# Patient Record
Sex: Female | Born: 1955 | Race: White | Hispanic: No | Marital: Married | Smoking: Never smoker
Health system: Southern US, Community
[De-identification: ages and names within clinical notes are randomized; demographics above are authoritative.]

## PROBLEM LIST (undated history)

## (undated) DIAGNOSIS — D696 Thrombocytopenia, unspecified: Secondary | ICD-10-CM

## (undated) DIAGNOSIS — F458 Other somatoform disorders: Secondary | ICD-10-CM

## (undated) DIAGNOSIS — Z862 Personal history of diseases of the blood and blood-forming organs and certain disorders involving the immune mechanism: Secondary | ICD-10-CM

## (undated) DIAGNOSIS — Z87898 Personal history of other specified conditions: Secondary | ICD-10-CM

## (undated) DIAGNOSIS — E785 Hyperlipidemia, unspecified: Secondary | ICD-10-CM

## (undated) DIAGNOSIS — F419 Anxiety disorder, unspecified: Secondary | ICD-10-CM

## (undated) DIAGNOSIS — M1990 Unspecified osteoarthritis, unspecified site: Secondary | ICD-10-CM

## (undated) DIAGNOSIS — E039 Hypothyroidism, unspecified: Secondary | ICD-10-CM

## (undated) DIAGNOSIS — Z9289 Personal history of other medical treatment: Secondary | ICD-10-CM

## (undated) DIAGNOSIS — J45909 Unspecified asthma, uncomplicated: Secondary | ICD-10-CM

## (undated) HISTORY — PX: OTHER SURGICAL HISTORY: SHX169

## (undated) HISTORY — PX: ROTATOR CUFF REPAIR: SHX139

## (undated) HISTORY — PX: ABDOMINAL HYSTERECTOMY: SHX81

## (undated) HISTORY — PX: COLONOSCOPY: SHX174

---

## 2009-07-15 HISTORY — PX: TOTAL KNEE ARTHROPLASTY: SHX125

## 2016-05-30 ENCOUNTER — Other Ambulatory Visit: Payer: Self-pay | Admitting: Internal Medicine

## 2016-05-30 DIAGNOSIS — E039 Hypothyroidism, unspecified: Secondary | ICD-10-CM

## 2016-06-13 ENCOUNTER — Other Ambulatory Visit: Payer: Self-pay

## 2016-07-17 ENCOUNTER — Ambulatory Visit
Admission: RE | Admit: 2016-07-17 | Discharge: 2016-07-17 | Disposition: A | Discharge status: Home / Self Care | Payer: No Typology Code available for payment source | Source: Ambulatory Visit | Attending: Internal Medicine | Admitting: Internal Medicine

## 2016-07-17 DIAGNOSIS — E039 Hypothyroidism, unspecified: Secondary | ICD-10-CM

## 2016-10-18 ENCOUNTER — Other Ambulatory Visit: Payer: Self-pay | Admitting: Internal Medicine

## 2016-10-18 DIAGNOSIS — Z1231 Encounter for screening mammogram for malignant neoplasm of breast: Secondary | ICD-10-CM

## 2016-11-14 ENCOUNTER — Ambulatory Visit
Admission: RE | Admit: 2016-11-14 | Discharge: 2016-11-14 | Disposition: A | Discharge status: Home / Self Care | Payer: No Typology Code available for payment source | Source: Ambulatory Visit | Attending: Internal Medicine | Admitting: Internal Medicine

## 2016-11-14 DIAGNOSIS — Z1231 Encounter for screening mammogram for malignant neoplasm of breast: Secondary | ICD-10-CM

## 2017-04-14 ENCOUNTER — Ambulatory Visit (HOSPITAL_BASED_OUTPATIENT_CLINIC_OR_DEPARTMENT_OTHER)
Admission: RE | Admit: 2017-04-14 | Discharge: 2017-04-14 | Disposition: A | Discharge status: Home / Self Care | Payer: No Typology Code available for payment source | Source: Ambulatory Visit | Attending: Internal Medicine | Admitting: Internal Medicine

## 2017-04-14 ENCOUNTER — Other Ambulatory Visit: Payer: Self-pay | Admitting: Internal Medicine

## 2017-04-14 ENCOUNTER — Other Ambulatory Visit (HOSPITAL_BASED_OUTPATIENT_CLINIC_OR_DEPARTMENT_OTHER): Payer: Self-pay | Admitting: Internal Medicine

## 2017-04-14 DIAGNOSIS — N281 Cyst of kidney, acquired: Secondary | ICD-10-CM | POA: Diagnosis not present

## 2017-04-14 DIAGNOSIS — K760 Fatty (change of) liver, not elsewhere classified: Secondary | ICD-10-CM | POA: Insufficient documentation

## 2017-04-14 DIAGNOSIS — R109 Unspecified abdominal pain: Secondary | ICD-10-CM

## 2017-04-15 ENCOUNTER — Other Ambulatory Visit: Payer: No Typology Code available for payment source

## 2017-07-18 ENCOUNTER — Other Ambulatory Visit: Payer: Self-pay | Admitting: Internal Medicine

## 2017-07-18 ENCOUNTER — Ambulatory Visit
Admission: RE | Admit: 2017-07-18 | Discharge: 2017-07-18 | Disposition: A | Discharge status: Home / Self Care | Payer: No Typology Code available for payment source | Source: Ambulatory Visit | Attending: Internal Medicine | Admitting: Internal Medicine

## 2017-07-18 DIAGNOSIS — R05 Cough: Secondary | ICD-10-CM

## 2017-07-18 DIAGNOSIS — R059 Cough, unspecified: Secondary | ICD-10-CM

## 2017-07-18 DIAGNOSIS — J9801 Acute bronchospasm: Secondary | ICD-10-CM

## 2017-12-19 ENCOUNTER — Other Ambulatory Visit: Payer: Self-pay | Admitting: Internal Medicine

## 2017-12-19 DIAGNOSIS — Z1231 Encounter for screening mammogram for malignant neoplasm of breast: Secondary | ICD-10-CM

## 2018-01-13 ENCOUNTER — Ambulatory Visit
Admission: RE | Admit: 2018-01-13 | Discharge: 2018-01-13 | Disposition: A | Discharge status: Home / Self Care | Payer: PRIVATE HEALTH INSURANCE | Source: Ambulatory Visit | Attending: Internal Medicine | Admitting: Internal Medicine

## 2018-01-13 DIAGNOSIS — Z1231 Encounter for screening mammogram for malignant neoplasm of breast: Secondary | ICD-10-CM

## 2019-04-12 ENCOUNTER — Other Ambulatory Visit: Payer: Self-pay | Admitting: Physician Assistant

## 2019-04-12 DIAGNOSIS — Z1231 Encounter for screening mammogram for malignant neoplasm of breast: Secondary | ICD-10-CM

## 2019-04-29 DIAGNOSIS — U071 COVID-19: Secondary | ICD-10-CM

## 2019-04-29 HISTORY — DX: COVID-19: U07.1

## 2019-05-28 ENCOUNTER — Ambulatory Visit
Admission: RE | Admit: 2019-05-28 | Discharge: 2019-05-28 | Disposition: A | Discharge status: Home / Self Care | Payer: PRIVATE HEALTH INSURANCE | Source: Ambulatory Visit | Attending: Physician Assistant | Admitting: Physician Assistant

## 2019-05-28 ENCOUNTER — Other Ambulatory Visit: Payer: Self-pay

## 2019-05-28 DIAGNOSIS — Z1231 Encounter for screening mammogram for malignant neoplasm of breast: Secondary | ICD-10-CM

## 2019-06-18 ENCOUNTER — Ambulatory Visit: Payer: Self-pay | Admitting: Orthopedic Surgery

## 2019-06-18 DIAGNOSIS — M1611 Unilateral primary osteoarthritis, right hip: Secondary | ICD-10-CM

## 2019-06-18 NOTE — H&P (View-Only) (Signed)
TOTAL HIP ADMISSION H&P  Patient is admitted for right total hip arthroplasty.  Subjective:  Chief Complaint: right hip pain  HPI: Janice Fernandez, 63 y.o. female, has a history of pain and functional disability in the right hip(s) due to arthritis and patient has failed non-surgical conservative treatments for greater than 12 weeks to include NSAID's and/or analgesics, flexibility and strengthening excercises, weight reduction as appropriate and activity modification.  Onset of symptoms was gradual starting 5 years ago with gradually worsening course since that time.The patient noted no past surgery on the right hip(s).  Patient currently rates pain in the right hip at 10 out of 10 with activity. Patient has night pain, worsening of pain with activity and weight bearing, trendelenberg gait, pain that interfers with activities of daily living and pain with passive range of motion. Patient has evidence of subchondral cysts, subchondral sclerosis, periarticular osteophytes and joint space narrowing by imaging studies. This condition presents safety issues increasing the risk of falls.  There is no current active infection.  There are no problems to display for this patient.  Past Medical History:  Diagnosis Date  . Anxiety   . Asthma   . COVID-19 04/29/2019  . History of anemia   . History of blood transfusion   . History of blood transfusion reaction    Developed fever transfusion stopped, next day transfusion done without any reaction  . Hyperlipidemia   . Hypothyroidism   . OA (osteoarthritis)   . Teeth grinding   . Thrombocytopenia ([facility])     Past Surgical History:  Procedure Laterality Date  . ABDOMINAL HYSTERECTOMY    . COLONOSCOPY    . ROTATOR CUFF REPAIR Right   . Thumb Replacement  Left   . TOTAL KNEE ARTHROPLASTY Right 2011    No current facility-administered medications for this visit.   No current outpatient medications on file.   Facility-Administered Medications  Ordered in Other Visits  Medication Dose Route Frequency Provider Last Rate Last Admin  . 0.9 %  sodium chloride infusion   Intravenous Continuous Rod Can, MD 75 mL/hr at 06/24/19 0559 New Bag at 06/24/19 0559  . acetaminophen (OFIRMEV) IV 1,000 mg  1,000 mg Intravenous To OR Dawna Jakes, Aaron Edelman, MD      . ceFAZolin (ANCEF) IVPB 2g/100 mL premix  2 g Intravenous On Call to [place], Aaron Edelman, MD      . chlorhexidine (HIBICLENS) 4 % liquid 4 application  60 mL Topical Once Edrik Rundle, Aaron Edelman, MD      . chlorhexidine (HIBICLENS) 4 % liquid 4 application  60 mL Topical Once Rod Can, MD      . lactated ringers infusion   Intravenous Continuous PRN Lavina Hamman, CRNA   New Bag at 06/24/19 [phone]  . povidone-iodine 10 % swab 2 application  2 application Topical Once Jasper Ruminski, Aaron Edelman, MD      . tranexamic acid (CYKLOKAPRON) IVPB 1,000 mg  1,000 mg Intravenous To OR Rod Can, MD       Allergies  Allergen Reactions  . Dilaudid [Hydromorphone Hcl] Nausea And Vomiting    Skin turned red  . Sulfa Antibiotics Nausea Only    Social History   Tobacco Use  . Smoking status: Never Smoker  . Smokeless tobacco: Never Used  Substance Use Topics  . Alcohol use: Yes    Comment: occ    Family History  Problem Relation Age of Onset  . Breast cancer Mother      Review of Systems  Constitutional: Negative.  HENT: Negative.   Eyes: Negative.   Respiratory: Negative.   Cardiovascular: Negative.   Gastrointestinal: Negative.   Genitourinary: Negative.   Musculoskeletal: Positive for joint pain.  Skin: Negative.   Neurological: Negative.   Endo/Heme/Allergies: Negative.   Psychiatric/Behavioral: The patient is nervous/anxious.     Objective:  Physical Exam  Vitals reviewed. Constitutional: She is oriented to person, place, and time. She appears well-developed and well-nourished.  HENT:  Head: Normocephalic and atraumatic.  Eyes: Pupils are equal, round, and reactive to light.  Conjunctivae and EOM are normal.  Neck: Normal range of motion. Neck supple.  Cardiovascular: Normal rate, regular rhythm and intact distal pulses.  Respiratory: Effort normal. No respiratory distress.  GI: Soft. She exhibits no distension.  Genitourinary:    Genitourinary Comments: deferred   Musculoskeletal:     Right hip: She exhibits decreased range of motion and bony tenderness.  Neurological: She is alert and oriented to person, place, and time. She has normal reflexes.  Skin: Skin is warm and dry.  Psychiatric: She has a normal mood and affect. Her behavior is normal. Judgment and thought content normal.    Vital signs in last 24 hours: @VSRANGES @  Labs:   Estimated body mass index is 32.14 kg/m as calculated from the following:   Height as of 06/22/19: 5' 4.5" (1.638 m).   Weight as of 06/22/19: 86.3 kg.   Imaging Review Plain radiographs demonstrate severe degenerative joint disease of the right hip(s). The bone quality appears to be adequate for age and reported activity level.      Assessment/Plan:  End stage arthritis, right hip(s)  The patient history, physical examination, clinical judgement of the provider and imaging studies are consistent with end stage degenerative joint disease of the right hip(s) and total hip arthroplasty is deemed medically necessary. The treatment options including medical management, injection therapy, arthroscopy and arthroplasty were discussed at length. The risks and benefits of total hip arthroplasty were presented and reviewed. The risks due to aseptic loosening, infection, stiffness, dislocation/subluxation,  thromboembolic complications and other imponderables were discussed.  The patient acknowledged the explanation, agreed to proceed with the plan and consent was signed. Patient is being admitted for inpatient treatment for surgery, pain control, PT, OT, prophylactic antibiotics, VTE prophylaxis, progressive ambulation and ADL's and  discharge planning.The patient is planning to be discharged home with HEP    Patient's anticipated LOS is less than 2 midnights, meeting these requirements: - Younger than 43 - Lives within 1 hour of care - Has a competent adult at home to recover with post-op recover - NO history of  - Chronic pain requiring opiods  - Diabetes  - Coronary Artery Disease  - Heart failure  - Heart attack  - Stroke  - DVT/VTE  - Cardiac arrhythmia  - Respiratory Failure/COPD  - Renal failure  - Anemia  - Advanced Liver disease

## 2019-06-18 NOTE — H&P (Signed)
TOTAL HIP ADMISSION H&P  Patient is admitted for right total hip arthroplasty.  Subjective:  Chief Complaint: right hip pain  HPI: Janice Fernandez, 63 y.o. female, has a history of pain and functional disability in the right hip(s) due to arthritis and patient has failed non-surgical conservative treatments for greater than 12 weeks to include NSAID's and/or analgesics, flexibility and strengthening excercises, weight reduction as appropriate and activity modification.  Onset of symptoms was gradual starting 5 years ago with gradually worsening course since that time.The patient noted no past surgery on the right hip(s).  Patient currently rates pain in the right hip at 10 out of 10 with activity. Patient has night pain, worsening of pain with activity and weight bearing, trendelenberg gait, pain that interfers with activities of daily living and pain with passive range of motion. Patient has evidence of subchondral cysts, subchondral sclerosis, periarticular osteophytes and joint space narrowing by imaging studies. This condition presents safety issues increasing the risk of falls.  There is no current active infection.  There are no problems to display for this patient.  Past Medical History:  Diagnosis Date  . Anxiety   . Asthma   . COVID-19 04/29/2019  . History of anemia   . History of blood transfusion   . History of blood transfusion reaction    Developed fever transfusion stopped, next day transfusion done without any reaction  . Hyperlipidemia   . Hypothyroidism   . OA (osteoarthritis)   . Teeth grinding   . Thrombocytopenia ([facility])     Past Surgical History:  Procedure Laterality Date  . ABDOMINAL HYSTERECTOMY    . COLONOSCOPY    . ROTATOR CUFF REPAIR Right   . Thumb Replacement  Left   . TOTAL KNEE ARTHROPLASTY Right 2011    No current facility-administered medications for this visit.   No current outpatient medications on file.   Facility-Administered Medications  Ordered in Other Visits  Medication Dose Route Frequency Provider Last Rate Last Admin  . 0.9 %  sodium chloride infusion   Intravenous Continuous Rod Can, MD 75 mL/hr at 06/24/19 0559 New Bag at 06/24/19 0559  . acetaminophen (OFIRMEV) IV 1,000 mg  1,000 mg Intravenous To OR Nadav Swindell, Aaron Edelman, MD      . ceFAZolin (ANCEF) IVPB 2g/100 mL premix  2 g Intravenous On Call to [place], Aaron Edelman, MD      . chlorhexidine (HIBICLENS) 4 % liquid 4 application  60 mL Topical Once Mckenzie Bove, Aaron Edelman, MD      . chlorhexidine (HIBICLENS) 4 % liquid 4 application  60 mL Topical Once Rod Can, MD      . lactated ringers infusion   Intravenous Continuous PRN Lavina Hamman, CRNA   New Bag at 06/24/19 [phone]  . povidone-iodine 10 % swab 2 application  2 application Topical Once Andrick Rust, Aaron Edelman, MD      . tranexamic acid (CYKLOKAPRON) IVPB 1,000 mg  1,000 mg Intravenous To OR Rod Can, MD       Allergies  Allergen Reactions  . Dilaudid [Hydromorphone Hcl] Nausea And Vomiting    Skin turned red  . Sulfa Antibiotics Nausea Only    Social History   Tobacco Use  . Smoking status: Never Smoker  . Smokeless tobacco: Never Used  Substance Use Topics  . Alcohol use: Yes    Comment: occ    Family History  Problem Relation Age of Onset  . Breast cancer Mother      Review of Systems  Constitutional: Negative.  HENT: Negative.   Eyes: Negative.   Respiratory: Negative.   Cardiovascular: Negative.   Gastrointestinal: Negative.   Genitourinary: Negative.   Musculoskeletal: Positive for joint pain.  Skin: Negative.   Neurological: Negative.   Endo/Heme/Allergies: Negative.   Psychiatric/Behavioral: The patient is nervous/anxious.     Objective:  Physical Exam  Vitals reviewed. Constitutional: She is oriented to person, place, and time. She appears well-developed and well-nourished.  HENT:  Head: Normocephalic and atraumatic.  Eyes: Pupils are equal, round, and reactive to light.  Conjunctivae and EOM are normal.  Neck: Normal range of motion. Neck supple.  Cardiovascular: Normal rate, regular rhythm and intact distal pulses.  Respiratory: Effort normal. No respiratory distress.  GI: Soft. She exhibits no distension.  Genitourinary:    Genitourinary Comments: deferred   Musculoskeletal:     Right hip: She exhibits decreased range of motion and bony tenderness.  Neurological: She is alert and oriented to person, place, and time. She has normal reflexes.  Skin: Skin is warm and dry.  Psychiatric: She has a normal mood and affect. Her behavior is normal. Judgment and thought content normal.    Vital signs in last 24 hours: @VSRANGES @  Labs:   Estimated body mass index is 32.14 kg/m as calculated from the following:   Height as of 06/22/19: 5' 4.5" (1.638 m).   Weight as of 06/22/19: 86.3 kg.   Imaging Review Plain radiographs demonstrate severe degenerative joint disease of the right hip(s). The bone quality appears to be adequate for age and reported activity level.      Assessment/Plan:  End stage arthritis, right hip(s)  The patient history, physical examination, clinical judgement of the provider and imaging studies are consistent with end stage degenerative joint disease of the right hip(s) and total hip arthroplasty is deemed medically necessary. The treatment options including medical management, injection therapy, arthroscopy and arthroplasty were discussed at length. The risks and benefits of total hip arthroplasty were presented and reviewed. The risks due to aseptic loosening, infection, stiffness, dislocation/subluxation,  thromboembolic complications and other imponderables were discussed.  The patient acknowledged the explanation, agreed to proceed with the plan and consent was signed. Patient is being admitted for inpatient treatment for surgery, pain control, PT, OT, prophylactic antibiotics, VTE prophylaxis, progressive ambulation and ADL's and  discharge planning.The patient is planning to be discharged home with HEP    Patient's anticipated LOS is less than 2 midnights, meeting these requirements: - Younger than 43 - Lives within 1 hour of care - Has a competent adult at home to recover with post-op recover - NO history of  - Chronic pain requiring opiods  - Diabetes  - Coronary Artery Disease  - Heart failure  - Heart attack  - Stroke  - DVT/VTE  - Cardiac arrhythmia  - Respiratory Failure/COPD  - Renal failure  - Anemia  - Advanced Liver disease

## 2019-06-21 ENCOUNTER — Encounter (HOSPITAL_COMMUNITY): Payer: Self-pay

## 2019-06-21 ENCOUNTER — Other Ambulatory Visit (HOSPITAL_COMMUNITY)
Admission: RE | Admit: 2019-06-21 | Discharge: 2019-06-21 | Disposition: A | Discharge status: Home / Self Care | Payer: PRIVATE HEALTH INSURANCE | Source: Ambulatory Visit | Attending: Orthopedic Surgery | Admitting: Orthopedic Surgery

## 2019-06-21 DIAGNOSIS — Z20828 Contact with and (suspected) exposure to other viral communicable diseases: Secondary | ICD-10-CM | POA: Diagnosis not present

## 2019-06-21 DIAGNOSIS — Z01812 Encounter for preprocedural laboratory examination: Secondary | ICD-10-CM | POA: Diagnosis not present

## 2019-06-21 NOTE — Patient Instructions (Signed)
DUE TO COVID-19 ONLY ONE VISITOR IS ALLOWED TO COME WITH YOU AND STAY IN THE WAITING ROOM ONLY DURING PRE OP AND PROCEDURE. THE ONE VISITOR MAY VISIT WITH YOU IN YOUR PRIVATE ROOM DURING VISITING HOURS ONLY!!   COVID SWAB TESTING COMPLETED ON:  Monday, Dec. 7, 2020   (Must self quarantine after testing. Follow instructions on handout.)             Your procedure is scheduled on: Thursday, Dec. 10, 2020   [facility]  Entr[facility]   Report to Short Stay at 5:30 AM   Call this number if you have problems the morning of surgery [phone]   Do not eat food :After Midnight.   May have liquids until 4:30AM the day of surgery   CLEAR LIQUID DIET  Foods Allowed                                                                     Foods Excluded  Water, Black Coffee and tea, regular and decaf                             liquids that you cannot  Plain Jell-O in any flavor  (No red)                                           see through such as: Fruit ices (not with fruit pulp)                                     milk, soups, orange juice  Iced Popsicles (No red)                                    All solid food Carbonated beverages, regular and diet                                    Apple juices Sports drinks like Gatorade (No red) Lightly seasoned clear broth or consume(fat free) Sugar, honey syrup  Sample Menu Breakfast                                Lunch                                     Supper Cranberry juice                    Beef broth                            Chicken broth Jell-O  Grape juice                           Apple juice Coffee or tea                        Jell-O                                      Popsicle                                                Coffee or tea                        Coffee or tea   Complete one Ensure drink the morning of surgery at 4:30AM the day of surgery   Brush your teeth the  morning of surgery.   Do NOT smoke after Midnight   Take these medicines the morning of surgery with A SIP OF WATER: Levothyroxine, Montelukast, Pravastatin                               You may not have any metal on your body including hair pins, jewelry, and body piercings             Do not wear make-up, lotions, powders, perfumes/cologne, or deodorant             Do not wear nail polish.  Do not shave  48 hours prior to surgery.                Do not bring valuables to the hospital. [facility] IS NOT             RESPONSIBLE   FOR VALUABLES.   Contacts, dentures or bridgework may not be worn into surgery.   Bring small overnight bag day of surgery.    Patients discharged the day of surgery will not be allowed to drive home.   Special Instructions: Bring a copy of your healthcare power of attorney and living will documents         the day of surgery if you haven't scanned them in before.              Please read over the following fact sheets you were given:  [facility] - Preparing for Surgery Before surgery, you can play an important role.  Because skin is not sterile, your skin needs to be as free of germs as possible.  You can reduce the number of germs on your skin by washing with CHG (chlorahexidine gluconate) soap before surgery.  CHG is an antiseptic cleaner which kills germs and bonds with the skin to continue killing germs even after washing. Please DO NOT use if you have an allergy to CHG or antibacterial soaps.  If your skin becomes reddened/irritated stop using the CHG and inform your nurse when you arrive at Short Stay. Do not shave (including legs and underarms) for at least 48 hours prior to the first CHG shower.  You may shave your face/neck.  Please follow these instructions carefully:  1.  Shower with CHG Soap the night  before surgery and the  morning of surgery.  2.  If you choose to wash your hair, wash your hair first as usual with your normal  shampoo.  3.   After you shampoo, rinse your hair and body thoroughly to remove the shampoo.                             4.  Use CHG as you would any other liquid soap.  You can apply chg directly to the skin and wash.  Gently with a scrungie or clean washcloth.  5.  Apply the CHG Soap to your body ONLY FROM THE NECK DOWN.   Do   not use on face/ open                           Wound or open sores. Avoid contact with eyes, ears mouth and   genitals (private parts).                       Wash face,  Genitals (private parts) with your normal soap.             6.  Wash thoroughly, paying special attention to the area where your    surgery  will be performed.  7.  Thoroughly rinse your body with warm water from the neck down.  8.  DO NOT shower/wash with your normal soap after using and rinsing off the CHG Soap.                9.  Pat yourself dry with a clean towel.            10.  Wear clean pajamas.            11.  Place clean sheets on your bed the night of your first shower and do not  sleep with pets. Day of Surgery : Do not apply any lotions/deodorants the morning of surgery.  Please wear clean clothes to the hospital/surgery center.  FAILURE TO FOLLOW THESE INSTRUCTIONS MAY RESULT IN THE CANCELLATION OF YOUR SURGERY  PATIENT SIGNATURE_________________________________  NURSE SIGNATURE__________________________________  ________________________________________________________________________   Janice Fernandez  An incentive spirometer is a tool that can help keep your lungs clear and active. This tool measures how well you are filling your lungs with each breath. Taking long deep breaths may help reverse or decrease the chance of developing breathing (pulmonary) problems (especially infection) following:  A long period of time when you are unable to move or be active. BEFORE THE PROCEDURE   If the spirometer includes an indicator to show your best effort, your nurse or respiratory therapist will  set it to a desired goal.  If possible, sit up straight or lean slightly forward. Try not to slouch.  Hold the incentive spirometer in an upright position. INSTRUCTIONS FOR USE  1. Sit on the edge of your bed if possible, or sit up as far as you can in bed or on a chair. 2. Hold the incentive spirometer in an upright position. 3. Breathe out normally. 4. Place the mouthpiece in your mouth and seal your lips tightly around it. 5. Breathe in slowly and as deeply as possible, raising the piston or the ball toward the top of the column. 6. Hold your breath for 3-5 seconds or for as long as possible. Allow the piston or ball to fall to the  bottom of the column. 7. Remove the mouthpiece from your mouth and breathe out normally. 8. Rest for a few seconds and repeat Steps 1 through 7 at least 10 times every 1-2 hours when you are awake. Take your time and take a few normal breaths between deep breaths. 9. The spirometer may include an indicator to show your best effort. Use the indicator as a goal to work toward during each repetition. 10. After each set of 10 deep breaths, practice coughing to be sure your lungs are clear. If you have an incision (the cut made at the time of surgery), support your incision when coughing by placing a pillow or rolled up towels firmly against it. Once you are able to get out of bed, walk around indoors and cough well. You may stop using the incentive spirometer when instructed by your caregiver.  RISKS AND COMPLICATIONS  Take your time so you do not get dizzy or light-headed.  If you are in pain, you may need to take or ask for pain medication before doing incentive spirometry. It is harder to take a deep breath if you are having pain. AFTER USE  Rest and breathe slowly and easily.  It can be helpful to keep track of a log of your progress. Your caregiver can provide you with a simple table to help with this. If you are using the spirometer at home, follow these  instructions: SEEK MEDICAL CARE IF:   You are having difficultly using the spirometer.  You have trouble using the spirometer as often as instructed.  Your pain medication is not giving enough relief while using the spirometer.  You develop fever of 100.5 F (38.1 C) or higher. SEEK IMMEDIATE MEDICAL CARE IF:   You cough up bloody sputum that had not been present before.  You develop fever of 102 F (38.9 C) or greater.  You develop worsening pain at or near the incision site. MAKE SURE YOU:   Understand these instructions.  Will watch your condition.  Will get help right away if you are not doing well or get worse. Document Released: 11/11/2006 Document Revised: 09/23/2011 Document Reviewed: 01/12/2007 Exit[employer] Patient Information 2014 [employer], [place].   ________________________________________________________________________  WHAT IS A BLOOD TRANSFUSION? Blood Transfusion Information  A transfusion is the replacement of blood or some of its parts. Blood is made up of multiple cells which provide different functions.  Red blood cells carry oxygen and are used for blood loss replacement.  White blood cells fight against infection.  Platelets control bleeding.  Plasma helps clot blood.  Other blood products are available for specialized needs, such as hemophilia or other clotting disorders. BEFORE THE TRANSFUSION  Who gives blood for transfusions?   Healthy volunteers who are fully evaluated to make sure their blood is safe. This is blood bank blood. Transfusion therapy is the safest it has ever been in the practice of medicine. Before blood is taken from a donor, a complete history is taken to make sure that person has no history of diseases nor engages in risky social behavior (examples are intravenous drug use or sexual activity with multiple partners). The donor's travel history is screened to minimize risk of transmitting infections, such as malaria. The donated  blood is tested for signs of infectious diseases, such as HIV and hepatitis. The blood is then tested to be sure it is compatible with you in order to minimize the chance of a transfusion reaction. If you or a relative donates blood, this  is often done in anticipation of surgery and is not appropriate for emergency situations. It takes many days to process the donated blood. RISKS AND COMPLICATIONS Although transfusion therapy is very safe and saves many lives, the main dangers of transfusion include:   Getting an infectious disease.  Developing a transfusion reaction. This is an allergic reaction to something in the blood you were given. Every precaution is taken to prevent this. The decision to have a blood transfusion has been considered carefully by your caregiver before blood is given. Blood is not given unless the benefits outweigh the risks. AFTER THE TRANSFUSION  Right after receiving a blood transfusion, you will usually feel much better and more energetic. This is especially true if your red blood cells have gotten low (anemic). The transfusion raises the level of the red blood cells which carry oxygen, and this usually causes an energy increase.  The nurse administering the transfusion will monitor you carefully for complications. HOME CARE INSTRUCTIONS  No special instructions are needed after a transfusion. You may find your energy is better. Speak with your caregiver about any limitations on activity for underlying diseases you may have. SEEK MEDICAL CARE IF:   Your condition is not improving after your transfusion.  You develop redness or irritation at the intravenous (IV) site. SEEK IMMEDIATE MEDICAL CARE IF:  Any of the following symptoms occur over the next 12 hours:  Shaking chills.  You have a temperature by mouth above 102 F (38.9 C), not controlled by medicine.  Chest, back, or muscle pain.  People around you feel you are not acting correctly or are  confused.  Shortness of breath or difficulty breathing.  Dizziness and fainting.  You get a rash or develop hives.  You have a decrease in urine output.  Your urine turns a dark color or changes to pink, red, or brown. Any of the following symptoms occur over the next 10 days:  You have a temperature by mouth above 102 F (38.9 C), not controlled by medicine.  Shortness of breath.  Weakness after normal activity.  The white part of the eye turns yellow (jaundice).  You have a decrease in the amount of urine or are urinating less often.  Your urine turns a dark color or changes to pink, red, or brown. Document Released: 06/28/2000 Document Revised: 09/23/2011 Document Reviewed: 02/15/2008 [facility] Patient Information 2014 [place], [place].  _______________________________________________________________________

## 2019-06-22 ENCOUNTER — Other Ambulatory Visit: Payer: Self-pay

## 2019-06-22 ENCOUNTER — Encounter (HOSPITAL_COMMUNITY): Payer: Self-pay

## 2019-06-22 ENCOUNTER — Encounter (HOSPITAL_COMMUNITY)
Admission: RE | Admit: 2019-06-22 | Discharge: 2019-06-22 | Disposition: A | Discharge status: Home / Self Care | Payer: PRIVATE HEALTH INSURANCE | Source: Ambulatory Visit | Attending: Orthopedic Surgery | Admitting: Orthopedic Surgery

## 2019-06-22 DIAGNOSIS — Z01812 Encounter for preprocedural laboratory examination: Secondary | ICD-10-CM | POA: Diagnosis present

## 2019-06-22 DIAGNOSIS — M1990 Unspecified osteoarthritis, unspecified site: Secondary | ICD-10-CM | POA: Insufficient documentation

## 2019-06-22 HISTORY — DX: Personal history of other medical treatment: Z92.89

## 2019-06-22 HISTORY — DX: Hypothyroidism, unspecified: E03.9

## 2019-06-22 HISTORY — DX: Personal history of other specified conditions: Z87.898

## 2019-06-22 HISTORY — DX: Thrombocytopenia, unspecified: D69.6

## 2019-06-22 HISTORY — DX: Unspecified asthma, uncomplicated: J45.909

## 2019-06-22 HISTORY — DX: Hyperlipidemia, unspecified: E78.5

## 2019-06-22 HISTORY — DX: Unspecified osteoarthritis, unspecified site: M19.90

## 2019-06-22 HISTORY — DX: Other somatoform disorders: F45.8

## 2019-06-22 HISTORY — DX: Personal history of diseases of the blood and blood-forming organs and certain disorders involving the immune mechanism: Z86.2

## 2019-06-22 HISTORY — DX: Anxiety disorder, unspecified: F41.9

## 2019-06-22 LAB — SURGICAL PCR SCREEN
MRSA, PCR: NEGATIVE
Staphylococcus aureus: NEGATIVE

## 2019-06-22 LAB — URINALYSIS, ROUTINE W REFLEX MICROSCOPIC
Bilirubin Urine: NEGATIVE
Glucose, UA: NEGATIVE mg/dL
Ketones, ur: NEGATIVE mg/dL
Nitrite: POSITIVE — AB
Protein, ur: NEGATIVE mg/dL
Specific Gravity, Urine: 1.02 (ref 1.005–1.030)
pH: 5 (ref 5.0–8.0)

## 2019-06-22 LAB — ABO/RH: ABO/RH(D): AB POS

## 2019-06-22 LAB — CBC
HCT: 41.5 % (ref 36.0–46.0)
Hemoglobin: 12.9 g/dL (ref 12.0–15.0)
MCH: 29.7 pg (ref 26.0–34.0)
MCHC: 31.1 g/dL (ref 30.0–36.0)
MCV: 95.4 fL (ref 80.0–100.0)
Platelets: 130 10*3/uL — ABNORMAL LOW (ref 150–400)
RBC: 4.35 MIL/uL (ref 3.87–5.11)
RDW: 13.8 % (ref 11.5–15.5)
WBC: 3 10*3/uL — ABNORMAL LOW (ref 4.0–10.5)
nRBC: 0 % (ref 0.0–0.2)

## 2019-06-22 LAB — COMPREHENSIVE METABOLIC PANEL
ALT: 20 U/L (ref 0–44)
AST: 26 U/L (ref 15–41)
Albumin: 4.1 g/dL (ref 3.5–5.0)
Alkaline Phosphatase: 52 U/L (ref 38–126)
Anion gap: 9 (ref 5–15)
BUN: 16 mg/dL (ref 8–23)
CO2: 30 mmol/L (ref 22–32)
Calcium: 9.6 mg/dL (ref 8.9–10.3)
Chloride: 102 mmol/L (ref 98–111)
Creatinine, Ser: 0.78 mg/dL (ref 0.44–1.00)
GFR calc Af Amer: >60 mL/min (ref >60)
GFR calc non Af Amer: >60 mL/min (ref >60)
Glucose, Bld: 90 mg/dL (ref 70–99)
Potassium: 4.5 mmol/L (ref 3.5–5.1)
Sodium: 141 mmol/L (ref 135–145)
Total Bilirubin: 0.5 mg/dL (ref 0.3–1.2)
Total Protein: 7.6 g/dL (ref 6.5–8.1)

## 2019-06-22 LAB — PROTIME-INR
INR: 0.9 (ref 0.8–1.2)
Prothrombin Time: 11.6 seconds (ref 11.4–15.2)

## 2019-06-22 LAB — NOVEL CORONAVIRUS, NAA (HOSP ORDER, SEND-OUT TO REF LAB; TAT 18-24 HRS): SARS-CoV-2, NAA: NOT DETECTED

## 2019-06-22 NOTE — Progress Notes (Addendum)
PCP - O. Clelland P.A Cardiologist - N/A  Chest x-ray - greater than 1 year EKG - N/A Stress Test - greater than 2 years ECHO -  greater than 2 years Cardiac Cath - N/A  Sleep Study-N/A CPAP - N/A  Fasting Blood Sugar - N/A Checks Blood Sugar __N/A___ times a day  Blood Thinner Instructions:  N/A Aspirin Instructions: N/A Last Dose: N/A  Anesthesia review: Thrombocytopenia, abnormal UA 06/22/2019  Patient denies shortness of breath, fever, cough and chest pain at PAT appointment   Patient verbalized understanding of instructions that were given to them at the PAT appointment. Patient was also instructed that they will need to review over the PAT instructions again at home before surgery.

## 2019-06-22 NOTE — Progress Notes (Signed)
UA results 06/22/2019 faxed to Dr. Lyla Glassing via epic.

## 2019-06-22 NOTE — Progress Notes (Signed)
Janice Fernandez had a COVID test done at [facility] and was positive for [name] on 04/29/2019.  She was tested through [facility] 06/21/2019 for documentation in our system.  She denies any COVID symptoms duri g pre op appointment today.

## 2019-06-22 NOTE — Progress Notes (Signed)
O. Clelland surgical clearance 06/15/2019 received and place in hard chart. EKG 06/14/2019 received placed in hard chart. O. Clelland's last office visit note 06/14/2019 received placed in hard chart. CBC/diff Hgb A1C  06/14/2019 results received and place in hard chart.

## 2019-06-23 NOTE — Anesthesia Preprocedure Evaluation (Addendum)
Anesthesia Evaluation  Patient identified by MRN, date of birth, ID band Patient awake    Reviewed: Allergy & Precautions, NPO status , Patient's Chart, lab work & pertinent test results  History of Anesthesia Complications Negative for: history of anesthetic complications  Airway Mallampati: II  TM Distance: >3 FB Neck ROM: Full    Dental no notable dental hx.    Pulmonary asthma ,  Covid-19 positive on 04/29/19   Pulmonary exam normal        Cardiovascular negative cardio ROS Normal cardiovascular exam     Neuro/Psych Anxiety negative neurological ROS     GI/Hepatic negative GI ROS, Neg liver ROS,   Endo/Other  Hypothyroidism   Renal/GU negative Renal ROS  negative genitourinary   Musculoskeletal  (+) Arthritis ,   Abdominal   Peds  Hematology negative hematology ROS (+)   Anesthesia Other Findings Day of surgery medications reviewed with patient.  Reproductive/Obstetrics negative OB ROS                            Anesthesia Physical Anesthesia Plan  ASA: II  Anesthesia Plan: Spinal   Post-op Pain Management:    Induction:   PONV Risk Score and Plan: 3 and Treatment may vary due to age or medical condition, Ondansetron, Propofol infusion, Dexamethasone and Midazolam  Airway Management Planned: Natural Airway and Simple Face Mask  Additional Equipment: None  Intra-op Plan:   Post-operative Plan:   Informed Consent: I have reviewed the patients History and Physical, chart, labs and discussed the procedure including the risks, benefits and alternatives for the proposed anesthesia with the patient or authorized representative who has indicated his/her understanding and acceptance.     Dental advisory given  Plan Discussed with: CRNA  Anesthesia Plan Comments:        Anesthesia Quick Evaluation

## 2019-06-24 ENCOUNTER — Ambulatory Visit (HOSPITAL_COMMUNITY): Payer: No Typology Code available for payment source

## 2019-06-24 ENCOUNTER — Ambulatory Visit (HOSPITAL_COMMUNITY)
Admission: RE | Admit: 2019-06-24 | Discharge: 2019-06-24 | Disposition: A | Discharge status: Home / Self Care | Payer: No Typology Code available for payment source | Attending: Orthopedic Surgery | Admitting: Orthopedic Surgery

## 2019-06-24 ENCOUNTER — Ambulatory Visit (HOSPITAL_COMMUNITY): Payer: No Typology Code available for payment source | Admitting: Certified Registered Nurse Anesthetist

## 2019-06-24 ENCOUNTER — Other Ambulatory Visit: Payer: Self-pay

## 2019-06-24 ENCOUNTER — Encounter (HOSPITAL_COMMUNITY)
Admission: RE | Disposition: A | Discharge status: Home / Self Care | Payer: Self-pay | Source: Home / Self Care | Attending: Orthopedic Surgery

## 2019-06-24 ENCOUNTER — Encounter (HOSPITAL_COMMUNITY): Payer: Self-pay | Admitting: Orthopedic Surgery

## 2019-06-24 ENCOUNTER — Ambulatory Visit (HOSPITAL_COMMUNITY): Payer: No Typology Code available for payment source | Admitting: Physician Assistant

## 2019-06-24 DIAGNOSIS — Z885 Allergy status to narcotic agent status: Secondary | ICD-10-CM | POA: Insufficient documentation

## 2019-06-24 DIAGNOSIS — M1611 Unilateral primary osteoarthritis, right hip: Secondary | ICD-10-CM | POA: Insufficient documentation

## 2019-06-24 DIAGNOSIS — Z419 Encounter for procedure for purposes other than remedying health state, unspecified: Secondary | ICD-10-CM

## 2019-06-24 DIAGNOSIS — Z882 Allergy status to sulfonamides status: Secondary | ICD-10-CM | POA: Insufficient documentation

## 2019-06-24 DIAGNOSIS — J45909 Unspecified asthma, uncomplicated: Secondary | ICD-10-CM | POA: Insufficient documentation

## 2019-06-24 DIAGNOSIS — Z09 Encounter for follow-up examination after completed treatment for conditions other than malignant neoplasm: Secondary | ICD-10-CM

## 2019-06-24 DIAGNOSIS — E785 Hyperlipidemia, unspecified: Secondary | ICD-10-CM | POA: Diagnosis not present

## 2019-06-24 DIAGNOSIS — E039 Hypothyroidism, unspecified: Secondary | ICD-10-CM | POA: Diagnosis not present

## 2019-06-24 DIAGNOSIS — Z96651 Presence of right artificial knee joint: Secondary | ICD-10-CM | POA: Diagnosis not present

## 2019-06-24 DIAGNOSIS — Z8619 Personal history of other infectious and parasitic diseases: Secondary | ICD-10-CM | POA: Insufficient documentation

## 2019-06-24 HISTORY — PX: TOTAL HIP ARTHROPLASTY: SHX124

## 2019-06-24 LAB — TYPE AND SCREEN
ABO/RH(D): AB POS
Antibody Screen: NEGATIVE

## 2019-06-24 SURGERY — ARTHROPLASTY, HIP, TOTAL, ANTERIOR APPROACH
Anesthesia: Spinal | Site: Hip | Laterality: Right

## 2019-06-24 MED ORDER — PROPOFOL 500 MG/50ML IV EMUL
INTRAVENOUS | Status: AC
  Filled 2019-06-24: qty 150

## 2019-06-24 MED ORDER — FENTANYL CITRATE (PF) 100 MCG/2ML IJ SOLN
INTRAMUSCULAR | Status: DC | PRN
  Administered 2019-06-24: 25 ug via INTRAVENOUS
  Administered 2019-06-24: 50 ug via INTRAVENOUS
  Administered 2019-06-24: 25 ug via INTRAVENOUS

## 2019-06-24 MED ORDER — PROMETHAZINE HCL 25 MG/ML IJ SOLN: 6.2500 mg | INTRAMUSCULAR | Status: DC | PRN

## 2019-06-24 MED ORDER — ASPIRIN 81 MG PO CHEW: 81.0000 mg | CHEWABLE_TABLET | Freq: Two times a day (BID) | ORAL | 0 refills | Status: AC

## 2019-06-24 MED ORDER — SODIUM CHLORIDE 0.9 % IR SOLN
Status: DC | PRN
  Administered 2019-06-24: 3000 mL

## 2019-06-24 MED ORDER — FENTANYL CITRATE (PF) 100 MCG/2ML IJ SOLN
25.0000 ug | INTRAMUSCULAR | Status: DC | PRN
  Administered 2019-06-24: 50 ug via INTRAVENOUS

## 2019-06-24 MED ORDER — BUPIVACAINE HCL (PF) 0.25 % IJ SOLN
INTRAMUSCULAR | Status: AC
  Filled 2019-06-24: qty 30

## 2019-06-24 MED ORDER — LACTATED RINGERS IV SOLN
INTRAVENOUS | Status: DC | PRN
  Administered 2019-06-24: 06:00:00 via INTRAVENOUS

## 2019-06-24 MED ORDER — SODIUM CHLORIDE 0.9 % IV SOLN
INTRAVENOUS | Status: DC
  Administered 2019-06-24: 06:00:00 via INTRAVENOUS

## 2019-06-24 MED ORDER — MIDAZOLAM HCL 5 MG/5ML IJ SOLN
INTRAMUSCULAR | Status: DC | PRN
  Administered 2019-06-24: 2 mg via INTRAVENOUS

## 2019-06-24 MED ORDER — BUPIVACAINE HCL (PF) 0.25 % IJ SOLN
INTRAMUSCULAR | Status: DC | PRN
  Administered 2019-06-24: 30 mL

## 2019-06-24 MED ORDER — OXYCODONE HCL 5 MG PO TABS
5.0000 mg | ORAL_TABLET | Freq: Once | ORAL | Status: AC | PRN
  Administered 2019-06-24: 5 mg via ORAL

## 2019-06-24 MED ORDER — KETOROLAC TROMETHAMINE 30 MG/ML IJ SOLN
INTRAMUSCULAR | Status: DC | PRN
  Administered 2019-06-24: 30 mg

## 2019-06-24 MED ORDER — MIDAZOLAM HCL 2 MG/2ML IJ SOLN
INTRAMUSCULAR | Status: AC
  Filled 2019-06-24: qty 2

## 2019-06-24 MED ORDER — PROPOFOL 10 MG/ML IV BOLUS
INTRAVENOUS | Status: DC | PRN
  Administered 2019-06-24: 10 mg via INTRAVENOUS

## 2019-06-24 MED ORDER — OXYCODONE HCL 5 MG/5ML PO SOLN: 5.0000 mg | Freq: Once | ORAL | Status: AC | PRN

## 2019-06-24 MED ORDER — MEPIVACAINE HCL (PF) 2 % IJ SOLN
INTRAMUSCULAR | Status: AC
  Filled 2019-06-24: qty 20

## 2019-06-24 MED ORDER — ACETAMINOPHEN 10 MG/ML IV SOLN: 1000.0000 mg | INTRAVENOUS | Status: DC

## 2019-06-24 MED ORDER — GLYCOPYRROLATE 0.2 MG/ML IJ SOLN
INTRAMUSCULAR | Status: DC | PRN
  Administered 2019-06-24: .2 mg via INTRAVENOUS

## 2019-06-24 MED ORDER — METHOCARBAMOL 500 MG IVPB - SIMPLE MED
500.0000 mg | Freq: Four times a day (QID) | INTRAVENOUS | Status: DC | PRN
  Administered 2019-06-24: 500 mg via INTRAVENOUS

## 2019-06-24 MED ORDER — ISOPROPYL ALCOHOL 70 % SOLN
Status: DC | PRN
  Administered 2019-06-24: 1 via TOPICAL

## 2019-06-24 MED ORDER — FENTANYL CITRATE (PF) 100 MCG/2ML IJ SOLN
INTRAMUSCULAR | Status: AC
  Filled 2019-06-24: qty 2

## 2019-06-24 MED ORDER — METHOCARBAMOL 500 MG PO TABS: 500.0000 mg | ORAL_TABLET | Freq: Four times a day (QID) | ORAL | Status: DC | PRN

## 2019-06-24 MED ORDER — OXYCODONE HCL 5 MG PO TABS
ORAL_TABLET | ORAL | Status: AC
  Filled 2019-06-24: qty 1

## 2019-06-24 MED ORDER — DEXAMETHASONE SODIUM PHOSPHATE 10 MG/ML IJ SOLN
INTRAMUSCULAR | Status: DC | PRN
  Administered 2019-06-24: 8 mg via INTRAVENOUS

## 2019-06-24 MED ORDER — CEFAZOLIN SODIUM-DEXTROSE 2-4 GM/100ML-% IV SOLN
2.0000 g | INTRAVENOUS | Status: AC
  Administered 2019-06-24: 2 g via INTRAVENOUS
  Filled 2019-06-24: qty 100

## 2019-06-24 MED ORDER — POVIDONE-IODINE 10 % EX SWAB
2.0000 "application " | Freq: Once | CUTANEOUS | Status: AC
  Administered 2019-06-24: 2 via TOPICAL

## 2019-06-24 MED ORDER — TRANEXAMIC ACID-NACL 1000-0.7 MG/100ML-% IV SOLN
1000.0000 mg | INTRAVENOUS | Status: AC
  Administered 2019-06-24: 1000 mg via INTRAVENOUS
  Filled 2019-06-24: qty 100

## 2019-06-24 MED ORDER — ACETAMINOPHEN 500 MG PO TABS
1000.0000 mg | ORAL_TABLET | Freq: Once | ORAL | Status: AC
  Administered 2019-06-24: 1000 mg via ORAL
  Filled 2019-06-24: qty 2

## 2019-06-24 MED ORDER — CHLORHEXIDINE GLUCONATE 4 % EX LIQD: 60.0000 mL | Freq: Once | CUTANEOUS | Status: DC

## 2019-06-24 MED ORDER — ISOPROPYL ALCOHOL 70 % SOLN
Status: AC
  Filled 2019-06-24: qty 480

## 2019-06-24 MED ORDER — METHOCARBAMOL 500 MG IVPB - SIMPLE MED
INTRAVENOUS | Status: AC
  Filled 2019-06-24: qty 50

## 2019-06-24 MED ORDER — POVIDONE-IODINE 10 % EX SWAB: 2.0000 "application " | Freq: Once | CUTANEOUS | Status: DC

## 2019-06-24 MED ORDER — BUPIVACAINE IN DEXTROSE 0.75-8.25 % IT SOLN
INTRATHECAL | Status: DC | PRN
  Administered 2019-06-24: 1.6 mg via INTRATHECAL

## 2019-06-24 MED ORDER — ONDANSETRON HCL 4 MG PO TABS: 4.0000 mg | ORAL_TABLET | Freq: Three times a day (TID) | ORAL | 0 refills | Status: AC | PRN

## 2019-06-24 MED ORDER — SENNA 8.6 MG PO TABS: 2.0000 | ORAL_TABLET | Freq: Every day | ORAL | 1 refills | Status: AC

## 2019-06-24 MED ORDER — SODIUM CHLORIDE (PF) 0.9 % IJ SOLN
INTRAMUSCULAR | Status: AC
  Filled 2019-06-24: qty 50

## 2019-06-24 MED ORDER — PROPOFOL 10 MG/ML IV BOLUS
INTRAVENOUS | Status: AC
  Filled 2019-06-24: qty 20

## 2019-06-24 MED ORDER — SODIUM CHLORIDE (PF) 0.9 % IJ SOLN
INTRAMUSCULAR | Status: DC | PRN
  Administered 2019-06-24: 30 mL

## 2019-06-24 MED ORDER — GLYCOPYRROLATE PF 0.2 MG/ML IJ SOSY
PREFILLED_SYRINGE | INTRAMUSCULAR | Status: AC
  Filled 2019-06-24: qty 1

## 2019-06-24 MED ORDER — PHENYLEPHRINE HCL-NACL 10-0.9 MG/250ML-% IV SOLN
INTRAVENOUS | Status: DC | PRN
  Administered 2019-06-24: 20 ug/min via INTRAVENOUS

## 2019-06-24 MED ORDER — 0.9 % SODIUM CHLORIDE (POUR BTL) OPTIME
TOPICAL | Status: DC | PRN
  Administered 2019-06-24: 1000 mL

## 2019-06-24 MED ORDER — ONDANSETRON HCL 4 MG/2ML IJ SOLN
INTRAMUSCULAR | Status: DC | PRN
  Administered 2019-06-24: 4 mg via INTRAVENOUS

## 2019-06-24 MED ORDER — WATER FOR IRRIGATION, STERILE IR SOLN
Status: DC | PRN
  Administered 2019-06-24: 2000 mL

## 2019-06-24 MED ORDER — KETOROLAC TROMETHAMINE 30 MG/ML IJ SOLN
INTRAMUSCULAR | Status: AC
  Filled 2019-06-24: qty 1

## 2019-06-24 MED ORDER — HYDROCODONE-ACETAMINOPHEN 5-325 MG PO TABS: 1.0000 | ORAL_TABLET | ORAL | 0 refills | Status: AC | PRN

## 2019-06-24 MED ORDER — SODIUM CHLORIDE 0.9 % IR SOLN
Status: DC | PRN
  Administered 2019-06-24: 1000 mL

## 2019-06-24 MED ORDER — PROPOFOL 500 MG/50ML IV EMUL
INTRAVENOUS | Status: DC | PRN
  Administered 2019-06-24: 60 ug/kg/min via INTRAVENOUS

## 2019-06-24 SURGICAL SUPPLY — 59 items
ACETAB CUP W GRIPTION 54MM (Plate) ×1 IMPLANT
ACETAB CUP W/GRIPTION 54 (Plate) ×2 IMPLANT
BAG DECANTER FOR FLEXI CONT (MISCELLANEOUS) IMPLANT
BAG ZIPLOCK 12X15 (MISCELLANEOUS) IMPLANT
BLADE SURG SZ10 CARB STEEL (BLADE) ×6 IMPLANT
CHLORAPREP W/TINT 26 (MISCELLANEOUS) ×3 IMPLANT
COVER PERINEAL POST (MISCELLANEOUS) ×3 IMPLANT
COVER SURGICAL LIGHT HANDLE (MISCELLANEOUS) ×3 IMPLANT
COVER WAND RF STERILE (DRAPES) IMPLANT
CUP ACETAB W/GRIPTION 54 (Plate) ×1 IMPLANT
DECANTER SPIKE VIAL GLASS SM (MISCELLANEOUS) ×3 IMPLANT
DERMABOND ADVANCED (GAUZE/BANDAGES/DRESSINGS) ×2
DERMABOND ADVANCED .7 DNX12 (GAUZE/BANDAGES/DRESSINGS) ×1 IMPLANT
DRAPE IMP U-DRAPE 54X76 (DRAPES) ×3 IMPLANT
DRAPE SHEET LG 3/4 BI-LAMINATE (DRAPES) ×9 IMPLANT
DRAPE STERI IOBAN 125X83 (DRAPES) ×3 IMPLANT
DRAPE U-SHAPE 47X51 STRL (DRAPES) ×6 IMPLANT
DRSG AQUACEL AG ADV 3.5X10 (GAUZE/BANDAGES/DRESSINGS) ×3 IMPLANT
ELECT REM PT RETURN 15FT ADLT (MISCELLANEOUS) ×3 IMPLANT
GAUZE SPONGE 4X4 12PLY STRL (GAUZE/BANDAGES/DRESSINGS) ×3 IMPLANT
GLOVE BIO SURGEON STRL SZ8.5 (GLOVE) ×9 IMPLANT
GLOVE BIOGEL PI IND STRL 7.5 (GLOVE) ×1 IMPLANT
GLOVE BIOGEL PI IND STRL 8.5 (GLOVE) ×1 IMPLANT
GLOVE BIOGEL PI INDICATOR 7.5 (GLOVE) ×2
GLOVE BIOGEL PI INDICATOR 8.5 (GLOVE) ×2
GOWN SPEC L3 XXLG W/TWL (GOWN DISPOSABLE) ×3 IMPLANT
HANDPIECE INTERPULSE COAX TIP (DISPOSABLE) ×2
HEAD FEMORAL 32 CERAMIC (Hips) ×3 IMPLANT
HOLDER FOLEY CATH W/STRAP (MISCELLANEOUS) ×3 IMPLANT
HOOD PEEL AWAY FLYTE STAYCOOL (MISCELLANEOUS) ×12 IMPLANT
JET LAVAGE IRRISEPT WOUND (IRRIGATION / IRRIGATOR) ×3
KIT TURNOVER KIT A (KITS) IMPLANT
LAVAGE JET IRRISEPT WOUND (IRRIGATION / IRRIGATOR) ×1 IMPLANT
LINER PINN ACET NEUT 32X54 ×3 IMPLANT
MANIFOLD NEPTUNE II (INSTRUMENTS) ×3 IMPLANT
MARKER SKIN DUAL TIP RULER LAB (MISCELLANEOUS) ×3 IMPLANT
NDL SAFETY ECLIPSE 18X1.5 (NEEDLE) ×1 IMPLANT
NEEDLE HYPO 18GX1.5 SHARP (NEEDLE) ×2
NEEDLE SPNL 18GX3.5 QUINCKE PK (NEEDLE) ×3 IMPLANT
PACK ANTERIOR HIP CUSTOM (KITS) ×3 IMPLANT
PENCIL SMOKE EVACUATOR (MISCELLANEOUS) ×3 IMPLANT
SAW OSC TIP CART 19.5X105X1.3 (SAW) ×3 IMPLANT
SEALER BIPOLAR AQUA 6.0 (INSTRUMENTS) ×3 IMPLANT
SET HNDPC FAN SPRY TIP SCT (DISPOSABLE) ×1 IMPLANT
STEM TRI LOC BPS GRIPTON SZ 5 (Hips) ×1 IMPLANT
SUT ETHIBOND NAB CT1 #1 30IN (SUTURE) ×6 IMPLANT
SUT MNCRL AB 3-0 PS2 18 (SUTURE) ×6 IMPLANT
SUT MNCRL AB 4-0 PS2 18 (SUTURE) ×3 IMPLANT
SUT MON AB 2-0 CT1 36 (SUTURE) ×6 IMPLANT
SUT STRATAFIX PDO 1 14 VIOLET (SUTURE) ×2
SUT STRATFX PDO 1 14 VIOLET (SUTURE) ×1
SUT VIC AB 2-0 CT1 27 (SUTURE) ×2
SUT VIC AB 2-0 CT1 TAPERPNT 27 (SUTURE) ×1 IMPLANT
SUTURE STRATFX PDO 1 14 VIOLET (SUTURE) ×1 IMPLANT
SYR 3ML LL SCALE MARK (SYRINGE) ×3 IMPLANT
TRAY FOLEY MTR SLVR 16FR STAT (SET/KITS/TRAYS/PACK) ×3 IMPLANT
TRI LOC BPS W GRIPTON SZ 5 (Hips) ×3 IMPLANT
WATER STERILE IRR 1000ML POUR (IV SOLUTION) ×3 IMPLANT
YANKAUER SUCT BULB TIP 10FT TU (MISCELLANEOUS) ×3 IMPLANT

## 2019-06-24 NOTE — Discharge Instructions (Signed)
°Dr. Mabry Santarelli °Joint Replacement Specialist °[facility] °[address] °[place], [place] [address] °[phone] ° ° °TOTAL HIP REPLACEMENT POSTOPERATIVE DIRECTIONS ° ° ° °Hip Rehabilitation, Guidelines Following Surgery  ° °WEIGHT BEARING °Weight bearing as tolerated with assist device (walker, cane, etc) as directed, use it as long as suggested by your surgeon or therapist, typically at least 4-6 weeks. ° °The results of a hip operation are greatly improved after range of motion and muscle strengthening exercises. Follow all safety measures which are given to protect your hip. If any of these exercises cause increased pain or swelling in your joint, decrease the amount until you are comfortable again. Then slowly increase the exercises. Call your caregiver if you have problems or questions.  ° °HOME CARE INSTRUCTIONS  °Most of the following instructions are designed to prevent the dislocation of your new hip.  °Remove items at home which could result in a fall. This includes throw rugs or furniture in walking pathways.  °Continue medications as instructed at time of discharge. °· You may have some home medications which will be placed on hold until you complete the course of blood thinner medication. °· You may start showering once you are discharged home. Do not remove your dressing. °Do not put on socks or shoes without following the instructions of your caregivers.   °Sit on chairs with arms. Use the chair arms to help push yourself up when arising.  °Arrange for the use of a toilet seat elevator so you are not sitting low.  °· Walk with walker as instructed.  °You may resume a sexual relationship in one month or when given the OK by your caregiver.  °Use walker as long as suggested by your caregivers.  °You may put full weight on your legs and walk as much as is comfortable. °Avoid periods of inactivity such as sitting longer than an hour when not asleep. This helps prevent  blood clots.  °You may return to work once you are cleared by your surgeon.  °Do not drive a car for 6 weeks or until released by your surgeon.  °Do not drive while taking narcotics.  °Wear elastic stockings for two weeks following surgery during the day but you may remove then at night.  °Make sure you keep all of your appointments after your operation with all of your doctors and caregivers. You should call the office at the above phone number and make an appointment for approximately two weeks after the date of your surgery. °Please pick up a stool softener and laxative for home use as long as you are requiring pain medications. °· ICE to the affected hip every three hours for 30 minutes at a time and then as needed for pain and swelling. Continue to use ice on the hip for pain and swelling from surgery. You may notice swelling that will progress down to the foot and ankle.  This is normal after surgery.  Elevate the leg when you are not up walking on it.   °It is important for you to complete the blood thinner medication as prescribed by your doctor. °· Continue to use the breathing machine which will help keep your temperature down.  It is common for your temperature to cycle up and down following surgery, especially at night when you are not up moving around and exerting yourself.  The breathing machine keeps your lungs expanded and your temperature down. ° °RANGE OF MOTION AND STRENGTHENING EXERCISES  °These exercises are   designed to help you keep full movement of your hip joint. Follow your caregiver's or physical therapist's instructions. Perform all exercises about fifteen times, three times per day or as directed. Exercise both hips, even if you have had only one joint replacement. These exercises can be done on a training (exercise) mat, on the floor, on a table or on a bed. Use whatever works the best and is most comfortable for you. Use music or television while you are exercising so that the exercises  are a pleasant break in your day. This will make your life better with the exercises acting as a break in routine you can look forward to.  °Lying on your back, slowly slide your foot toward your buttocks, raising your knee up off the floor. Then slowly slide your foot back down until your leg is straight again.  °Lying on your back spread your legs as far apart as you can without causing discomfort.  °Lying on your side, raise your upper leg and foot straight up from the floor as far as is comfortable. Slowly lower the leg and repeat.  °Lying on your back, tighten up the muscle in the front of your thigh (quadriceps muscles). You can do this by keeping your leg straight and trying to raise your heel off the floor. This helps strengthen the largest muscle supporting your knee.  °Lying on your back, tighten up the muscles of your buttocks both with the legs straight and with the knee bent at a comfortable angle while keeping your heel on the floor.  ° °SKILLED REHAB INSTRUCTIONS: °If the patient is transferred to a skilled rehab facility following release from the hospital, a list of the current medications will be sent to the facility for the patient to continue.  When discharged from the skilled rehab facility, please have the facility set up the patient's Home Health Physical Therapy prior to being released. Also, the skilled facility will be responsible for providing the patient with their medications at time of release from the facility to include their pain medication and their blood thinner medication. If the patient is still at the rehab facility at time of the two week follow up appointment, the skilled rehab facility will also need to assist the patient in arranging follow up appointment in our office and any transportation needs. ° °MAKE SURE YOU:  °Understand these instructions.  °Will watch your condition.  °Will get help right away if you are not doing well or get worse. ° °Pick up stool softner and  laxative for home use following surgery while on pain medications. °Do not remove your dressing. °The dressing is waterproof--it is OK to take showers. °Continue to use ice for pain and swelling after surgery. °Do not use any lotions or creams on the incision until instructed by your surgeon. °Total Hip Protocol. ° ° °

## 2019-06-24 NOTE — Op Note (Signed)
OPERATIVE REPORT  SURGEON: Rod Can, MD   ASSISTANT: Nehemiah Massed, PA-C.  PREOPERATIVE DIAGNOSIS: Right hip arthritis.   POSTOPERATIVE DIAGNOSIS: Right hip arthritis.   PROCEDURE: Right total hip arthroplasty, anterior approach.   IMPLANTS: DePuy Tri Lock stem, size 5, hi offset. DePuy Pinnacle Cup, size 54 mm. DePuy Altrx liner, size 32 by 54 mm, neutral. DePuy Biolox ceramic head ball, size 32 + 1 mm.  ANESTHESIA:  MAC and Spinal  ESTIMATED BLOOD LOSS:-300 mL    ANTIBIOTICS: 2 g Ancef.  DRAINS: None.  COMPLICATIONS: None.   CONDITION: PACU - hemodynamically stable.   BRIEF CLINICAL NOTE: Janice Fernandez is a 63 y.o. female with a long-standing history of Right hip arthritis. After failing conservative management, the patient was indicated for total hip arthroplasty. The risks, benefits, and alternatives to the procedure were explained, and the patient elected to proceed.  PROCEDURE IN DETAIL: Surgical site was marked by myself in the pre-op holding area. Once inside the operating room, spinal anesthesia was obtained, and a foley catheter was inserted. The patient was then positioned on the Hana table.  All bony prominences were well padded.  The hip was prepped and draped in the normal sterile surgical fashion.  A time-out was called verifying side and site of surgery. The patient received IV antibiotics within 60 minutes of beginning the procedure.   The direct anterior approach to the hip was performed through the Hueter interval.  Lateral femoral circumflex vessels were treated with the Auqumantys. The anterior capsule was exposed and an inverted T capsulotomy was made. The femoral neck cut was made to the level of the templated cut.  A corkscrew was placed into the head and the head was removed.  The femoral head was found to have eburnated bone. The head was passed to the back table and was measured.   Acetabular exposure was achieved, and the pulvinar and  labrum were excised. Sequential reaming of the acetabulum was then performed up to a size 53 mm reamer. A 54 mm cup was then opened and impacted into place at approximately 40 degrees of abduction and 20 degrees of anteversion. The final polyethylene liner was impacted into place and acetabular osteophytes were removed.    I then gained femoral exposure taking care to protect the abductors and greater trochanter.  This was performed using standard external rotation, extension, and adduction.  The capsule was peeled off the inner aspect of the greater trochanter, taking care to preserve the short external rotators. A cookie cutter was used to enter the femoral canal, and then the femoral canal finder was placed.  Sequential broaching was performed up to a size 5.  Calcar planer was used on the femoral neck remnant.  I placed a hi offset neck and a trial head ball.  The hip was reduced.  Leg lengths and offset were checked fluoroscopically.  The hip was dislocated and trial components were removed.  The final implants were placed, and the hip was reduced.  Fluoroscopy was used to confirm component position and leg lengths.  At 90 degrees of external rotation and full extension, the hip was stable to an anterior directed force.   The wound was copiously irrigated with Irrisept solution and normal saline using pule lavage.  Marcaine solution was injected into the periarticular soft tissue.  The wound was closed in layers using #1 Stratafix for the fascia, 2-0 Vicryl for the subcutaneous fat, 2-0 Monocryl for the deep dermal layer, 3-0 running Monocryl subcuticular stitch,  and Dermabond for the skin.  Once the glue was fully dried, an Aquacell Ag dressing was applied.  The patient was transported to the recovery room in stable condition.  Sponge, needle, and instrument counts were correct at the end of the case x2.  The patient tolerated the procedure well and there were no known complications.  Please note that a  surgical assistant was a medical necessity for this procedure to perform it in a safe and expeditious manner. Assistant was necessary to provide appropriate retraction of vital neurovascular structures, to prevent femoral fracture, and to allow for anatomic placement of the prosthesis.

## 2019-06-24 NOTE — Anesthesia Postprocedure Evaluation (Signed)
Anesthesia Post Note  Patient: Janice Fernandez  Procedure(s) Performed: TOTAL HIP ARTHROPLASTY ANTERIOR APPROACH (Right Hip)     Patient location during evaluation: PACU Anesthesia Type: Spinal Level of consciousness: awake and alert and oriented Pain management: pain level controlled Vital Signs Assessment: post-procedure vital signs reviewed and stable Respiratory status: spontaneous breathing, nonlabored ventilation and respiratory function stable Cardiovascular status: blood pressure returned to baseline Postop Assessment: no apparent nausea or vomiting and spinal receding Anesthetic complications: no    Last Vitals:  Vitals:   06/24/19 1145 06/24/19 1157  BP: 97/62 100/63  Pulse: 60 70  Resp: 14 16  Temp: (!) 36.3 C (!) 36.3 C  SpO2: 100% 94%    Last Pain:  Vitals:   06/24/19 1157  TempSrc:   PainSc: [place] Ziomara Birenbaum

## 2019-06-24 NOTE — Anesthesia Procedure Notes (Signed)
Spinal  Patient location during procedure: OR Start time: 06/24/2019 8:06 AM End time: 06/24/2019 8:08 AM Staffing Performed: anesthesiologist  Anesthesiologist: Brennan Bailey, MD Preanesthetic Checklist Completed: patient identified, IV checked, risks and benefits discussed, surgical consent, monitors and equipment checked, pre-op evaluation and timeout performed Spinal Block Patient position: sitting Prep: DuraPrep and site prepped and draped Patient monitoring: continuous pulse ox, blood pressure and heart rate Approach: midline Location: L3-4 Injection technique: single-shot Needle Needle type: Pencan  Needle gauge: 24 G Needle length: 9 cm Additional Notes Risks, benefits, and alternative discussed. Patient gave consent to procedure. Prepped and draped in sitting position. Patient sedated but responsive to voice. Clear CSF obtained after one needle redirection. Positive terminal aspiration. No pain or paraesthesias with injection. Patient tolerated procedure well. Vital signs stable. Tawny Asal, MD

## 2019-06-24 NOTE — Evaluation (Signed)
Physical Therapy Evaluation Patient Details Name: Janice Fernandez MRN: [id number] DOB: October 22, 1955 Today's Date: 06/24/2019   History of Present Illness  Pt s/p R THR and with hx of R TKR in 2011  Clinical Impression  Pt s/p R THR and presents with decreased R LE strength/ROM and post op pain limiting functional mobility.  Pt currently mobilizing at min guard/sup level and plans for dc home with family assist this date.  Pt reviewed car transfers, stairs and home therex program with written instruction provided and reviewed.    Follow Up Recommendations Follow surgeon's recommendation for DC plan and follow-up therapies    Equipment Recommendations  None recommended by PT    Recommendations for Other Services       Precautions / Restrictions Precautions Precautions: Fall Restrictions Weight Bearing Restrictions: No Other Position/Activity Restrictions: WBAT      Mobility  Bed Mobility Overal bed mobility: Needs Assistance Bed Mobility: Supine to Sit     Supine to sit: Min guard     General bed mobility comments: cues for sequence and use of L LE to self assist  Transfers Overall transfer level: Needs assistance Equipment used: Rolling walker (2 wheeled) Transfers: Sit to/from Stand Sit to Stand: Min guard;Supervision         General transfer comment: cues for LE management and use of UEs to self assist  Ambulation/Gait Ambulation/Gait assistance: Min guard Gait Distance (Feet): 120 Feet Assistive device: Rolling walker (2 wheeled) Gait Pattern/deviations: Step-to pattern;Decreased step length - right;Decreased step length - left;Shuffle;Trunk flexed Gait velocity: decr   General Gait Details: cues for sequence, posture and position from RW  Stairs Stairs: Yes Stairs assistance: Min assist Stair Management: One rail Left;Step to pattern;Forwards;With cane Number of Stairs: 3 General stair comments: Cues for sequence and foot/cane placement  Wheelchair  Mobility    Modified Rankin (Stroke Patients Only)       Balance Overall balance assessment: Mild deficits observed, not formally tested                                           Pertinent Vitals/Pain Pain Assessment: 0-10 Pain Score: 4  Pain Location: R hip Pain Descriptors / Indicators: Aching;Sore Pain Intervention(s): Limited activity within patient's tolerance;Monitored during session;Premedicated before session    [place] expects to be discharged to:: Private residence Living Arrangements: Spouse/significant other;Parent(Pt will be going to sisters house) Available Help at Discharge: Family Type of Home: House Home Access: Stairs to enter Entrance Stairs-Rails: Right Entrance Stairs-Number of Steps: 3 Home Layout: One level Home Equipment: [place] - 2 wheels;Cane - single point;Bedside commode Additional Comments: Pt going to stay at sister's home initially - above details pertain to sister's home    Prior Function Level of Independence: Independent               Hand Dominance        Extremity/Trunk Assessment   Upper Extremity Assessment Upper Extremity Assessment: Overall WFL for tasks assessed    Lower Extremity Assessment Lower Extremity Assessment: RLE deficits/detail RLE Deficits / Details: Strength at hip 2+/5 with AAROM at hip to 90 flex and 20 abd    Cervical / Trunk Assessment Cervical / Trunk Assessment: Normal  Communication   Communication: No difficulties  Cognition Arousal/Alertness: Awake/alert Behavior During Therapy: WFL for tasks assessed/performed Overall Cognitive Status: Within Functional Limits for tasks assessed  General Comments      Exercises Total Joint Exercises Ankle Circles/Pumps: AROM;Both;15 reps;Supine Quad Sets: AROM;Both;10 reps;Supine Heel Slides: AAROM;Right;15 reps;Supine Hip ABduction/ADduction: AAROM;Right;10  reps;Supine Long Arc Quad: AROM;Right;10 reps;Seated   Assessment/Plan    PT Assessment Patient needs continued PT services  PT Problem List Decreased strength;Decreased range of motion;Decreased activity tolerance;Decreased balance;Decreased mobility;Decreased knowledge of use of DME;Pain       PT Treatment Interventions DME instruction;Gait training;Stair training;Functional mobility training;Therapeutic activities;Therapeutic exercise;Patient/family education    PT Goals (Current goals can be found in the Care Plan section)  Acute Rehab PT Goals Patient Stated Goal: Regain IND PT Goal Formulation: All assessment and education complete, DC therapy Time For Goal Achievement: 06/24/19    Frequency Min 1X/week   Barriers to discharge        Co-evaluation               AM-PAC PT "6 Clicks" Mobility  Outcome Measure Help needed turning from your back to your side while in a flat bed without using bedrails?: A Little Help needed moving from lying on your back to sitting on the side of a flat bed without using bedrails?: A Little Help needed moving to and from a bed to a chair (including a wheelchair)?: A Little Help needed standing up from a chair using your arms (e.g., wheelchair or bedside chair)?: A Little Help needed to walk in hospital room?: A Little Help needed climbing 3-5 steps with a railing? : A Little 6 Click Score: 18    End of Session Equipment Utilized During Treatment: Gait belt Activity Tolerance: Patient tolerated treatment well Patient left: in chair;with call bell/phone within reach Nurse Communication: Mobility status PT Visit Diagnosis: Difficulty in walking, not elsewhere classified (R26.2)    Time: 1607-3710 PT Time Calculation (min) (ACUTE ONLY): 38 min   Charges:   PT Evaluation $PT Eval Low Complexity: 1 Low PT Treatments $Gait Training: 8-22 mins $Therapeutic Exercise: 8-22 mins        Mauro Kaufmann PT Acute Rehabilitation  Services Pager [phone] Office [phone]   Dijon Cosens 06/24/2019, 3:54 PM

## 2019-06-24 NOTE — Discharge Summary (Signed)
Physician Discharge Summary  Patient ID: Janice Fernandez MRN: [id number] DOB/AGE: 1956-06-27 63 y.o.  Admit date: 06/24/2019 Discharge date: 06/24/2019  Admission Diagnoses:  Osteoarthritis of right hip  Discharge Diagnoses:  Principal Problem:   Osteoarthritis of right hip   Past Medical History:  Diagnosis Date  . Anxiety   . Asthma   . COVID-19 04/29/2019  . History of anemia   . History of blood transfusion   . History of blood transfusion reaction    Developed fever transfusion stopped, next day transfusion done without any reaction  . Hyperlipidemia   . Hypothyroidism   . OA (osteoarthritis)   . Teeth grinding   . Thrombocytopenia ([place])     Surgeries: Procedure(s): TOTAL HIP ARTHROPLASTY ANTERIOR APPROACH on 06/24/2019   Consultants (if any):   Discharged Condition: Improved  Hospital Course: Janice Fernandez is an 63 y.o. female who was admitted 06/24/2019 with a diagnosis of Osteoarthritis of right hip and went to the operating room on 06/24/2019 and underwent the above named procedures.    She was given perioperative antibiotics:  Anti-infectives (From admission, onward)   Start     Dose/Rate Route Frequency Ordered Stop   06/24/19 0600  ceFAZolin (ANCEF) IVPB 2g/100 mL premix     2 g 200 mL/hr over 30 Minutes Intravenous On call to O.R. 06/24/19 [id number] 06/24/19 0806    .  She was given sequential compression devices, early ambulation, and ASA for DVT prophylaxis.  She benefited maximally from the hospital stay and there were no complications.    Recent vital signs:  Vitals:   06/24/19 0527  BP: 126/79  Pulse: 78  Resp: 14  Temp: 98.3 F (36.8 C)  SpO2: 99%    Recent laboratory studies:  Lab Results  Component Value Date   HGB 12.9 06/22/2019   Lab Results  Component Value Date   WBC 3.0 (L) 06/22/2019   PLT 130 (L) 06/22/2019   Lab Results  Component Value Date   INR 0.9 06/22/2019   Lab Results  Component Value Date   NA 141  06/22/2019   K 4.5 06/22/2019   CL 102 06/22/2019   CO2 30 06/22/2019   BUN 16 06/22/2019   CREATININE 0.78 06/22/2019   GLUCOSE 90 06/22/2019    Discharge Medications:   Allergies as of 06/24/2019      Reactions   Dilaudid [hydromorphone Hcl] Nausea And Vomiting   Skin turned red   Sulfa Antibiotics Nausea Only      Medication List    STOP taking these medications   acetaminophen 650 MG CR tablet Commonly known as: TYLENOL   diphenhydramine-acetaminophen 25-500 MG Tabs tablet Commonly known as: TYLENOL PM     TAKE these medications   ADULT GUMMY PO Take 2 tablets by mouth daily.   Aleve PM 220-25 MG Tabs Generic drug: Naproxen Sod-diphenhydrAMINE Take 1 tablet by mouth at bedtime.   aspirin 81 MG chewable tablet Commonly known as: Aspirin Childrens Chew 1 tablet (81 mg total) by mouth 2 (two) times daily with a meal.   Biotin 10000 MCG Tbdp Take 10,000 mcg by mouth daily.   cholecalciferol 25 MCG (1000 UT) tablet Commonly known as: VITAMIN D Take 2,000 Units by mouth daily.   docusate calcium 240 MG capsule Commonly known as: SURFAK Take 240 mg by mouth daily as needed for mild constipation.   Fish Oil 1000 MG Cpdr Take 1,000 mg by mouth daily.   FLAXSEED OIL PO Take 1,300 mg by mouth  daily.   fluticasone 50 MCG/ACT nasal spray Commonly known as: FLONASE Place 1-2 sprays into both nostrils daily as needed for allergies.   GARLIC PO Take 7,341 mg by mouth daily.   HYDROcodone-acetaminophen 5-325 MG tablet Commonly known as: Norco Take 1 tablet by mouth every 4 (four) hours as needed for moderate pain.   levothyroxine 175 MCG tablet Commonly known as: SYNTHROID Take 175 mcg by mouth daily before breakfast.   montelukast 10 MG tablet Commonly known as: SINGULAIR Take 10 mg by mouth daily.   ondansetron 4 MG tablet Commonly known as: Zofran Take 1 tablet (4 mg total) by mouth every 8 (eight) hours as needed for nausea or vomiting.    pravastatin 40 MG tablet Commonly known as: PRAVACHOL Take 40 mg by mouth daily.   senna 8.6 MG Tabs tablet Commonly known as: SENOKOT Take 2 tablets (17.2 mg total) by mouth at bedtime.       Diagnostic Studies: DG C-Arm 1-60 Min-No Report  Result Date: 06/24/2019 Fluoroscopy was utilized by the requesting physician.  No radiographic interpretation.   MM 3D SCREEN BREAST BILATERAL  Result Date: 05/28/2019 CLINICAL DATA:  Screening. EXAM: DIGITAL SCREENING BILATERAL MAMMOGRAM WITH TOMO AND CAD COMPARISON:  Previous exam(s). ACR Breast Density Category b: There are scattered areas of fibroglandular density. FINDINGS: There are no findings suspicious for malignancy. Images were processed with CAD. IMPRESSION: No mammographic evidence of malignancy. A result letter of this screening mammogram will be mailed directly to the patient. RECOMMENDATION: Screening mammogram in one year. (Code:[id number]) BI-RADS CATEGORY  1: Negative. Electronically Signed   By: Gerome Sam III M.D   On: 05/28/2019 17:26    Disposition: Discharge disposition: 01-Home or Self Care       Discharge Instructions    Call MD / Call 911   Complete by: As directed    If you experience chest pain or shortness of breath, CALL 911 and be transported to the hospital emergency room.  If you develope a fever above 101 F, pus (white drainage) or increased drainage or redness at the wound, or calf pain, call your surgeon's office.   Constipation Prevention   Complete by: As directed    Drink plenty of fluids.  Prune juice may be helpful.  You may use a stool softener, such as Colace (over the counter) 100 mg twice a day.  Use MiraLax (over the counter) for constipation as needed.   Diet - low sodium heart healthy   Complete by: As directed    Driving restrictions   Complete by: As directed    No driving for 6 weeks   Increase activity slowly as tolerated   Complete by: As directed    Lifting restrictions    Complete by: As directed    No lifting for 6 weeks   TED hose   Complete by: As directed    Use stockings (TED hose) for 2 weeks on both leg(s).  You may remove them at night for sleeping.      Follow-up Information    Kiely Cousar, Arlys John, MD. Schedule an appointment as soon as possible for a visit in 2 weeks.   Specialty: Orthopedic Surgery Why: For wound re-check Contact information: [address] [place] [place] [place] [address] [phone]            Signed: Iline Oven Dezmond Downie 06/24/2019, 9:46 AM

## 2019-06-24 NOTE — Interval H&P Note (Signed)
History and Physical Interval Note:  06/24/2019 9:53 AM  Janice Fernandez  has presented today for surgery, with the diagnosis of Degenerative joint disease right hip.  The various methods of treatment have been discussed with the patient and family. After consideration of risks, benefits and other options for treatment, the patient has consented to  Procedure(s): TOTAL HIP ARTHROPLASTY ANTERIOR APPROACH (Right) as a surgical intervention.  The patient's history has been reviewed, patient examined, no change in status, stable for surgery.  I have reviewed the patient's chart and labs.  Questions were answered to the patient's satisfaction.     Hilton Cork Lillyrose Reitan

## 2019-06-24 NOTE — Transfer of Care (Signed)
Immediate Anesthesia Transfer of Care Note  Patient: Janice Fernandez  Procedure(s) Performed: Procedure(s): TOTAL HIP ARTHROPLASTY ANTERIOR APPROACH (Right)  Patient Location: PACU  Anesthesia Type:Spinal  Level of Consciousness:  sedated, patient cooperative and responds to stimulation  Airway & Oxygen Therapy:Patient Spontanous Breathing and Patient connected to face mask oxgen  Post-op Assessment:  Report given to PACU RN and Post -op Vital signs reviewed and stable  Post vital signs:  Reviewed and stable  Last Vitals:  Vitals:   06/24/19 0527  BP: 126/79  Pulse: 78  Resp: 14  Temp: 36.8 C  SpO2: 76%    Complications: No apparent anesthesia complications

## 2019-06-25 ENCOUNTER — Encounter: Payer: Self-pay | Admitting: *Deleted

## 2020-04-14 ENCOUNTER — Other Ambulatory Visit: Payer: Self-pay | Admitting: Physician Assistant

## 2020-04-14 DIAGNOSIS — Z1231 Encounter for screening mammogram for malignant neoplasm of breast: Secondary | ICD-10-CM

## 2020-05-17 ENCOUNTER — Ambulatory Visit: Payer: PRIVATE HEALTH INSURANCE | Attending: Internal Medicine

## 2020-05-17 ENCOUNTER — Other Ambulatory Visit (HOSPITAL_COMMUNITY): Payer: Self-pay | Admitting: Internal Medicine

## 2020-05-17 DIAGNOSIS — Z23 Encounter for immunization: Secondary | ICD-10-CM

## 2020-05-17 NOTE — Progress Notes (Signed)
   Covid-19 Vaccination Clinic  Name:  Janice Fernandez    MRN: [id number] DOB: Jan 23, 1956  05/17/2020  [name] was observed post Covid-19 immunization for 15 minutes without incident. She was provided with Vaccine Information Sheet and instruction to access the V-Safe system.   [name] was instructed to call 911 with any severe reactions post vaccine: Marland Kitchen Difficulty breathing  . Swelling of face and throat  . A fast heartbeat  . A bad rash all over body  . Dizziness and weakness

## 2020-05-29 ENCOUNTER — Ambulatory Visit: Payer: PRIVATE HEALTH INSURANCE

## 2020-07-04 ENCOUNTER — Ambulatory Visit
Admission: RE | Admit: 2020-07-04 | Discharge: 2020-07-04 | Disposition: A | Discharge status: Home / Self Care | Payer: PRIVATE HEALTH INSURANCE | Source: Ambulatory Visit | Attending: Physician Assistant | Admitting: Physician Assistant

## 2020-07-04 ENCOUNTER — Other Ambulatory Visit: Payer: Self-pay

## 2020-07-04 DIAGNOSIS — Z1231 Encounter for screening mammogram for malignant neoplasm of breast: Secondary | ICD-10-CM

## 2020-12-06 ENCOUNTER — Ambulatory Visit: Payer: PRIVATE HEALTH INSURANCE | Attending: Internal Medicine

## 2020-12-06 DIAGNOSIS — Z23 Encounter for immunization: Secondary | ICD-10-CM

## 2020-12-06 NOTE — Progress Notes (Signed)
   Covid-19 Vaccination Clinic  Name:  Janice Fernandez    MRN: [id number] DOB: 1955-10-07  12/06/2020  [name] was observed post Covid-19 immunization for 15 minutes without incident. She was provided with Vaccine Information Sheet and instruction to access the V-Safe system.   [name] was instructed to call 911 with any severe reactions post vaccine: Marland Kitchen Difficulty breathing  . Swelling of face and throat  . A fast heartbeat  . A bad rash all over body  . Dizziness and weakness   Immunizations Administered    Name Date Dose VIS Date Route   PFIZER Comrnaty(Gray TOP) Covid-19 Vaccine 12/06/2020 11:47 AM 0.3 mL 06/22/2020 Intramuscular   Manufacturer: [employer], [employer]   Lot: [id number]   NDC: [phone]

## 2020-12-07 ENCOUNTER — Other Ambulatory Visit (HOSPITAL_COMMUNITY): Payer: Self-pay

## 2020-12-07 MED ORDER — COVID-19 MRNA VAC-TRIS(PFIZER) 30 MCG/0.3ML IM SUSP
INTRAMUSCULAR | 0 refills | Status: AC
  Filled 2020-12-07: qty 0.3, 17d supply, fill #0

## 2020-12-08 ENCOUNTER — Other Ambulatory Visit (HOSPITAL_COMMUNITY): Payer: Self-pay

## 2021-09-07 IMAGING — MG DIGITAL SCREENING BILAT W/ TOMO W/ CAD
6 of 10 series · 6 of 30 positions shown · non-contrast
Comparison: Previous exam(s).

CLINICAL DATA: Screening.

EXAM:
DIGITAL SCREENING BILATERAL MAMMOGRAM WITH TOMO AND CAD

[L CC synth-2D]
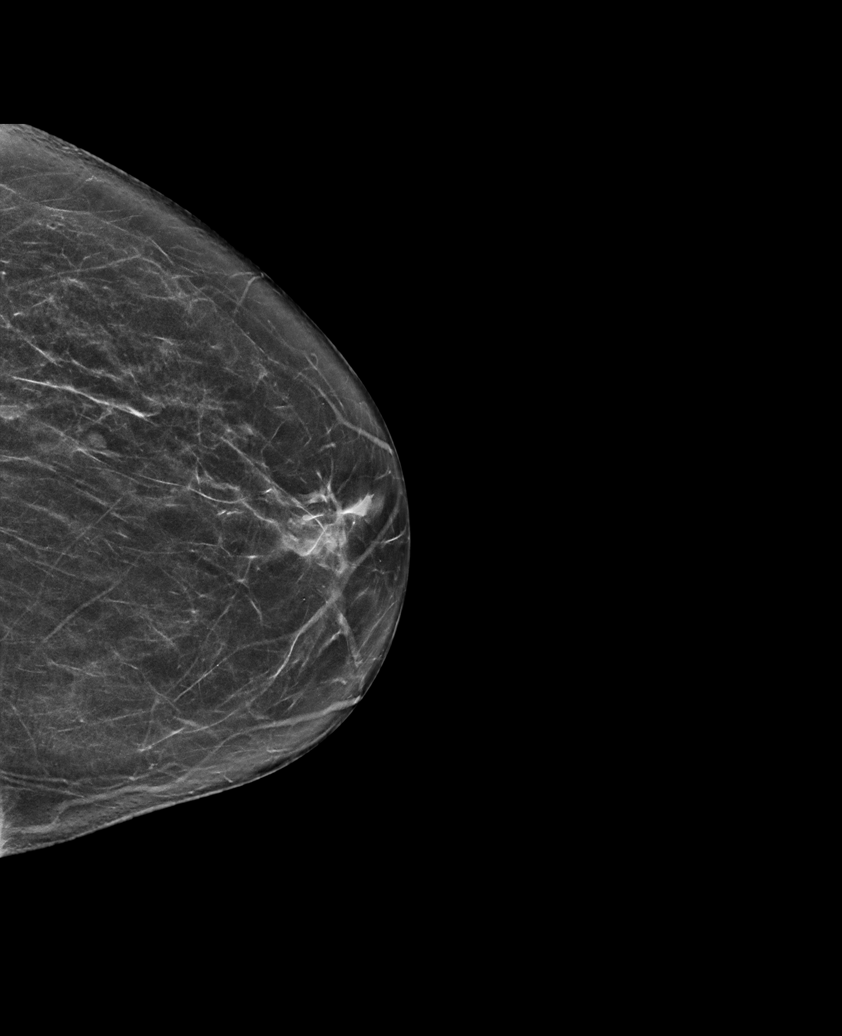

[L MLO synth-2D (1 of 2)]
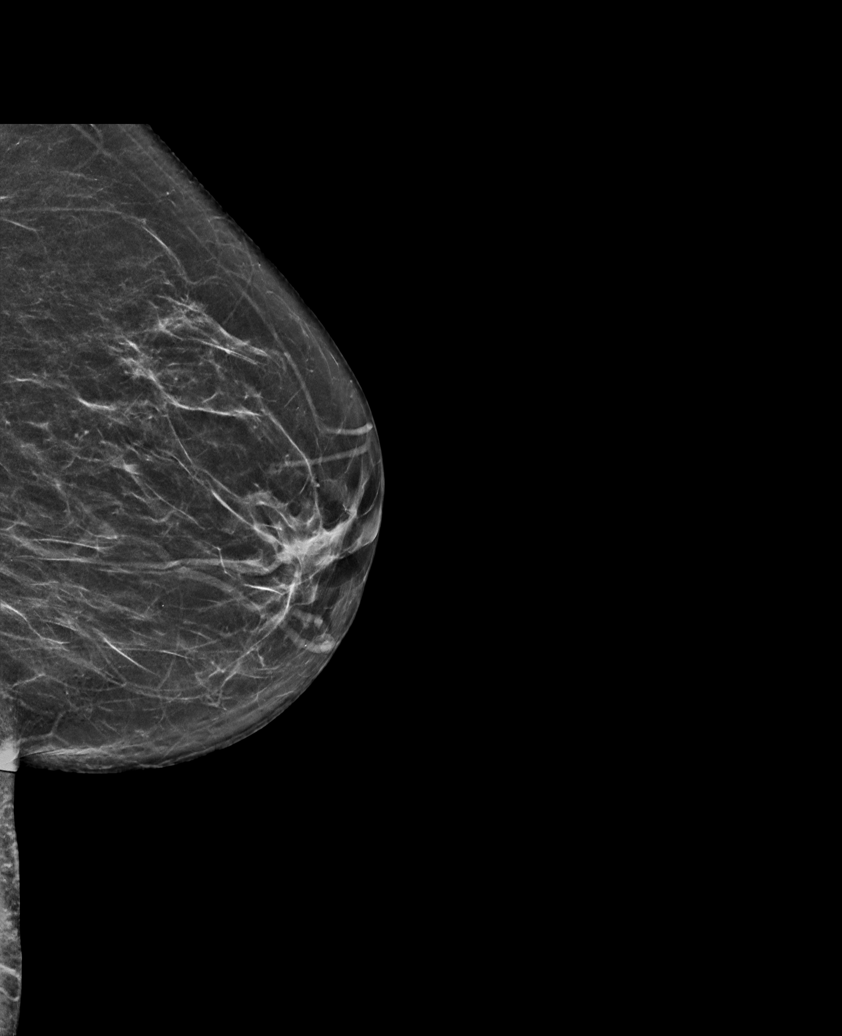

[R CC synth-2D]
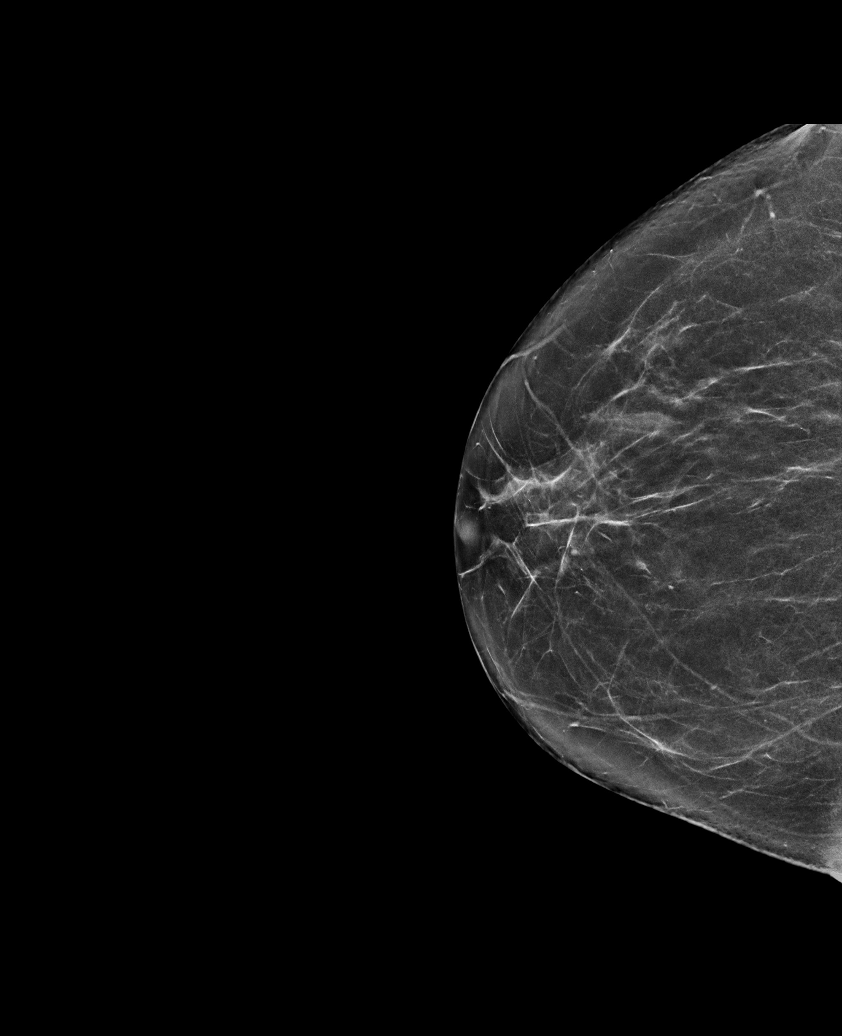

[L MLO synth-2D (2 of 2)]
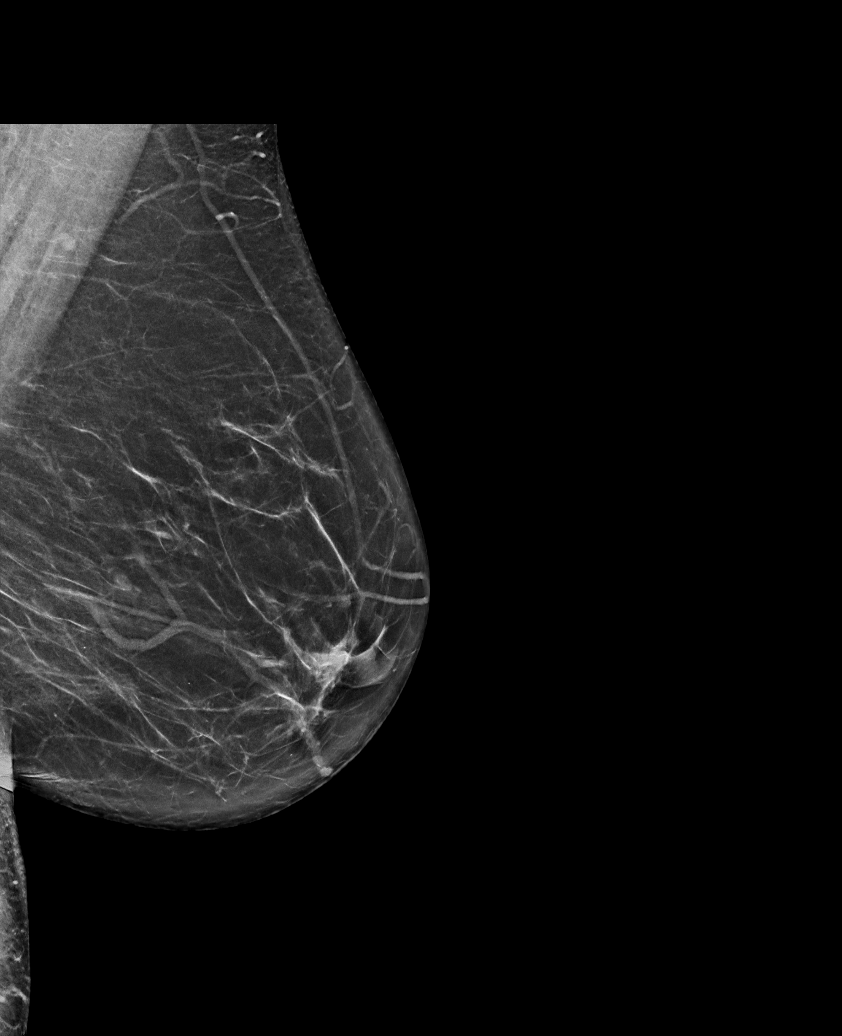

[R MLO synth-2D]
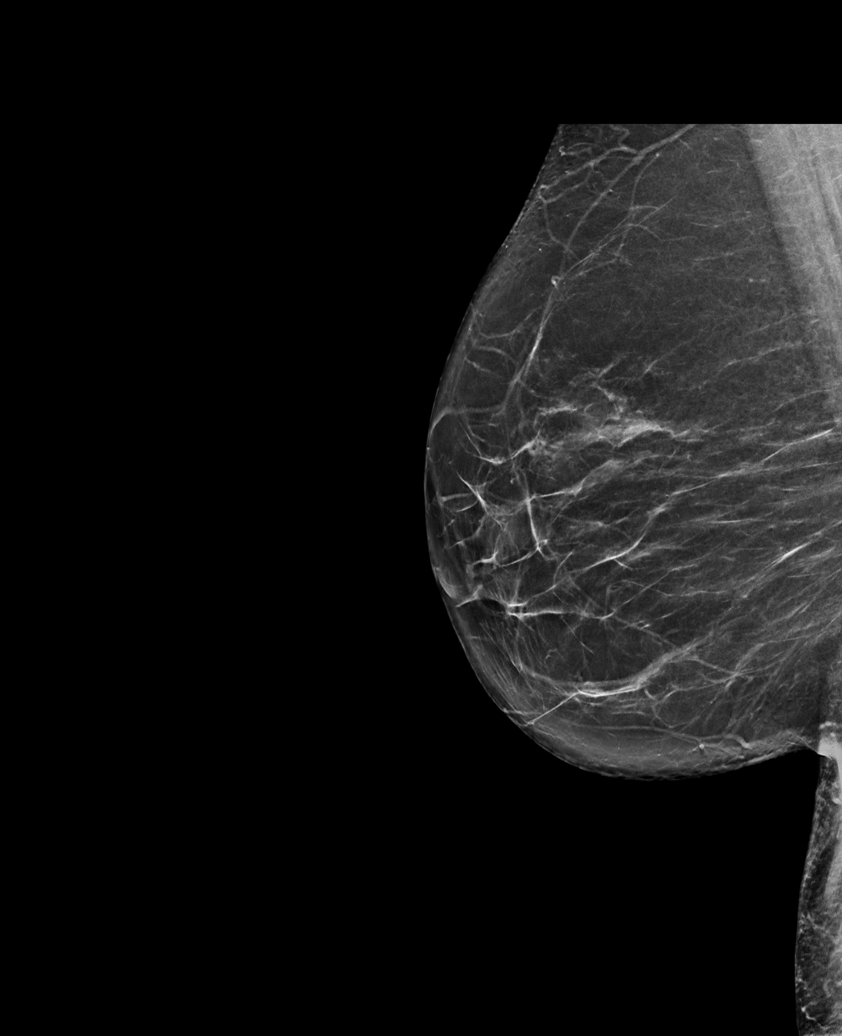

[L MLO tomo · tomo slice 26/51.0]
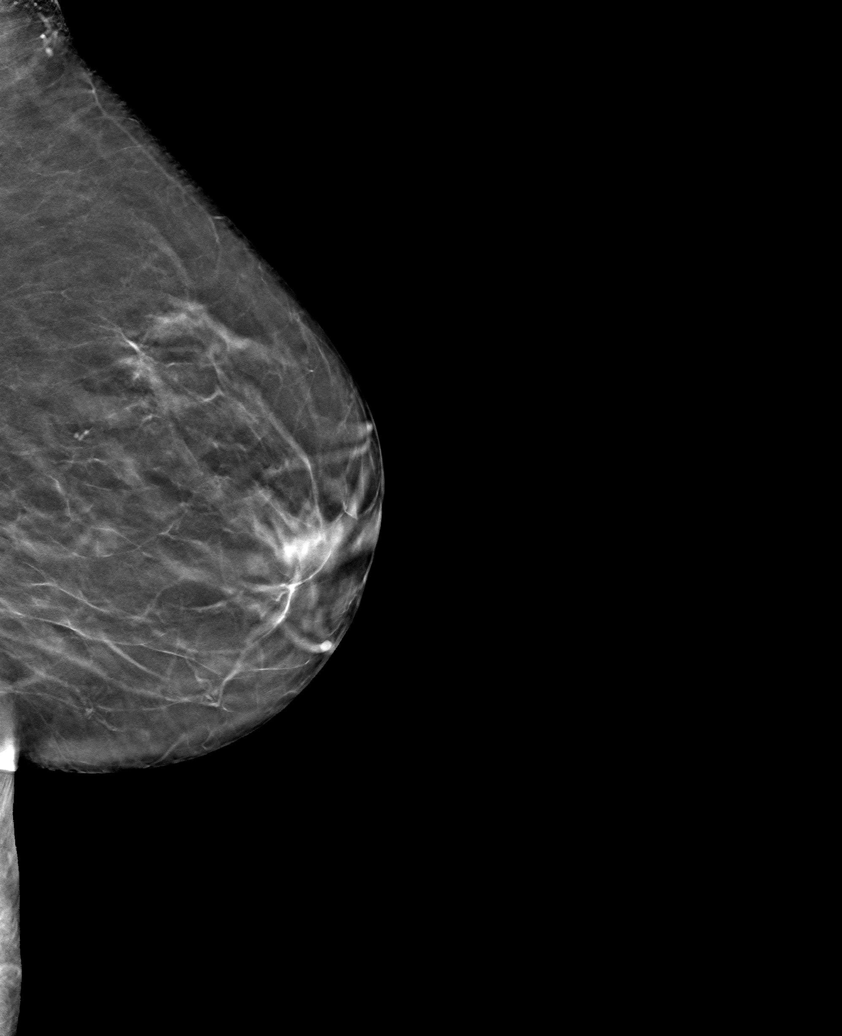

[6 of 30 positions shown; findings below may reference images not displayed]

ACR Breast Density Category b: There are scattered areas of
fibroglandular density.
FINDINGS: There are no findings suspicious for malignancy. Images were
processed with CAD.
IMPRESSION: No mammographic evidence of malignancy. A result letter of this
screening mammogram will be mailed directly to the patient.

RECOMMENDATION:
Screening mammogram in one year. (Code:[id number])

BI-RADS CATEGORY  1: Negative.

## 2021-11-17 LAB — EXTERNAL GENERIC LAB PROCEDURE: COLOGUARD: NEGATIVE
